# Patient Record
Sex: Male | Born: 1960
Health system: Southern US, Community
[De-identification: ages and names within clinical notes are randomized; demographics above are authoritative.]

## PROBLEM LIST (undated history)

## (undated) DIAGNOSIS — Z8614 Personal history of Methicillin resistant Staphylococcus aureus infection: Secondary | ICD-10-CM

---

## 2008-09-05 ENCOUNTER — Emergency Department (HOSPITAL_COMMUNITY): Admission: EM | Admit: 2008-09-05 | Discharge: 2008-09-05 | Payer: Self-pay | Admitting: Emergency Medicine

## 2008-10-08 ENCOUNTER — Emergency Department (HOSPITAL_COMMUNITY): Admission: EM | Admit: 2008-10-08 | Discharge: 2008-10-08 | Payer: Self-pay | Admitting: Emergency Medicine

## 2009-12-22 ENCOUNTER — Emergency Department (HOSPITAL_COMMUNITY): Admission: EM | Admit: 2009-12-22 | Discharge: 2009-12-22 | Payer: Self-pay | Admitting: Emergency Medicine

## 2010-02-09 ENCOUNTER — Emergency Department (HOSPITAL_COMMUNITY): Admission: EM | Admit: 2010-02-09 | Discharge: 2010-02-09 | Payer: Self-pay | Admitting: Emergency Medicine

## 2011-10-30 IMAGING — CR DG CERVICAL SPINE COMPLETE 4+V
5 series · 5 of 5 positions shown · non-contrast
Comparison: None.

CLINICAL DATA: Work injury with neck pain and left shoulder pain.

CERVICAL SPINE - COMPLETE 4+ VIEW

[w c-spine lat]
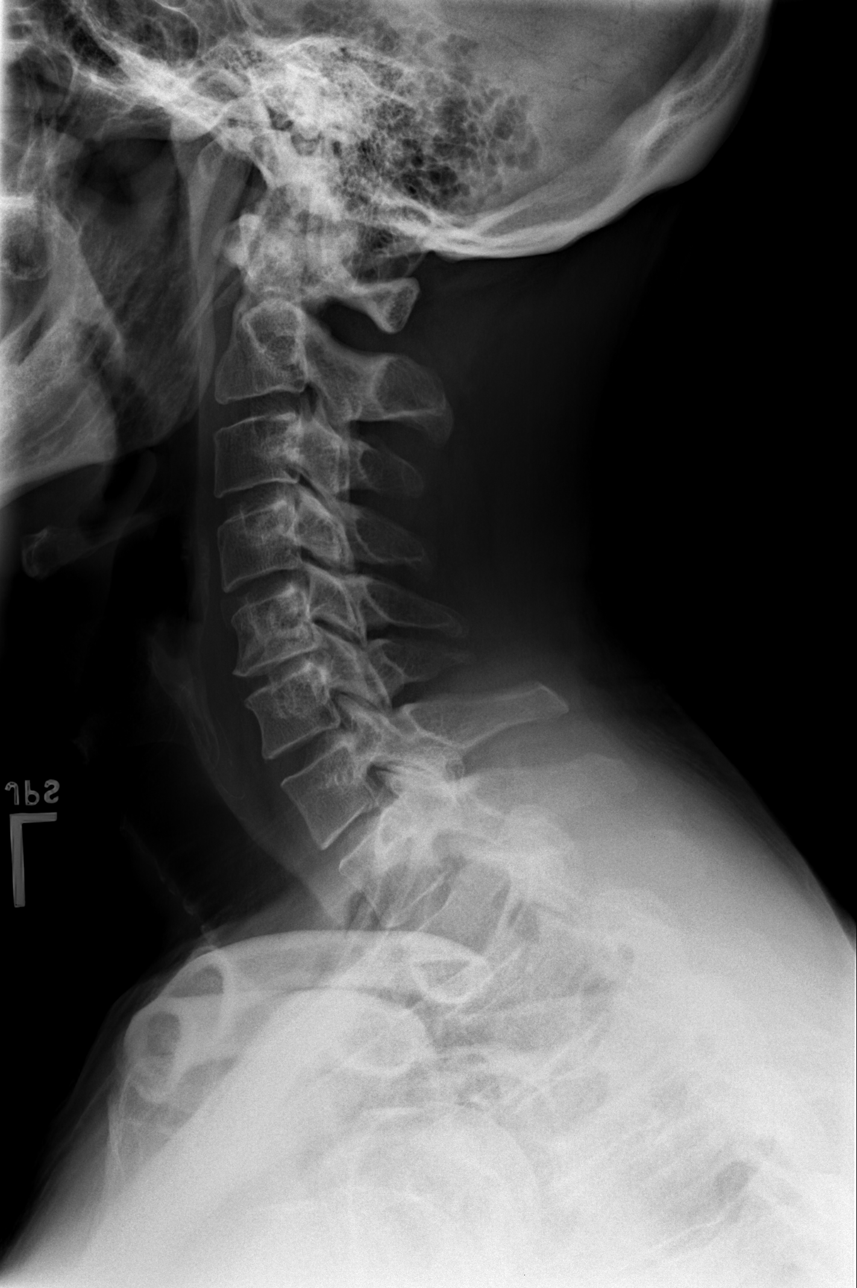

[w c-spine oblique * (1 of 2)]
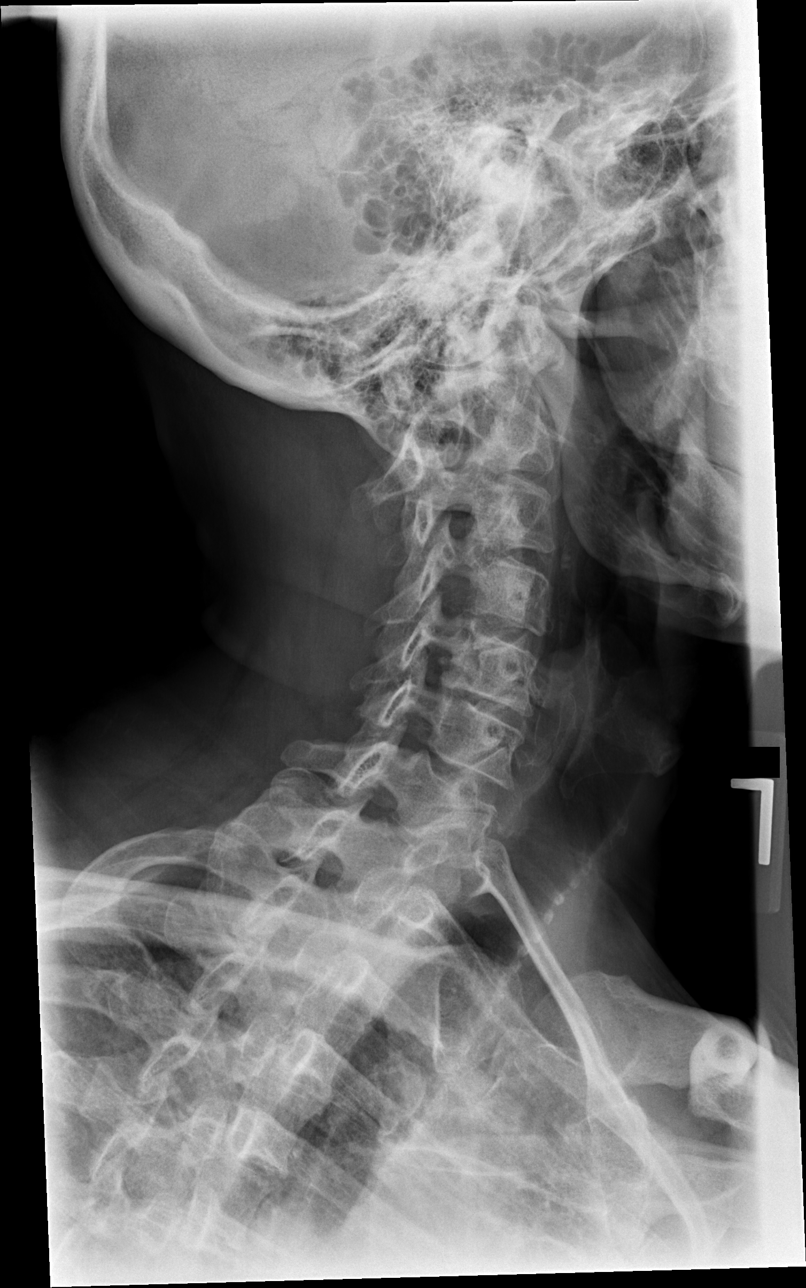

[w c-spine oblique * (2 of 2)]
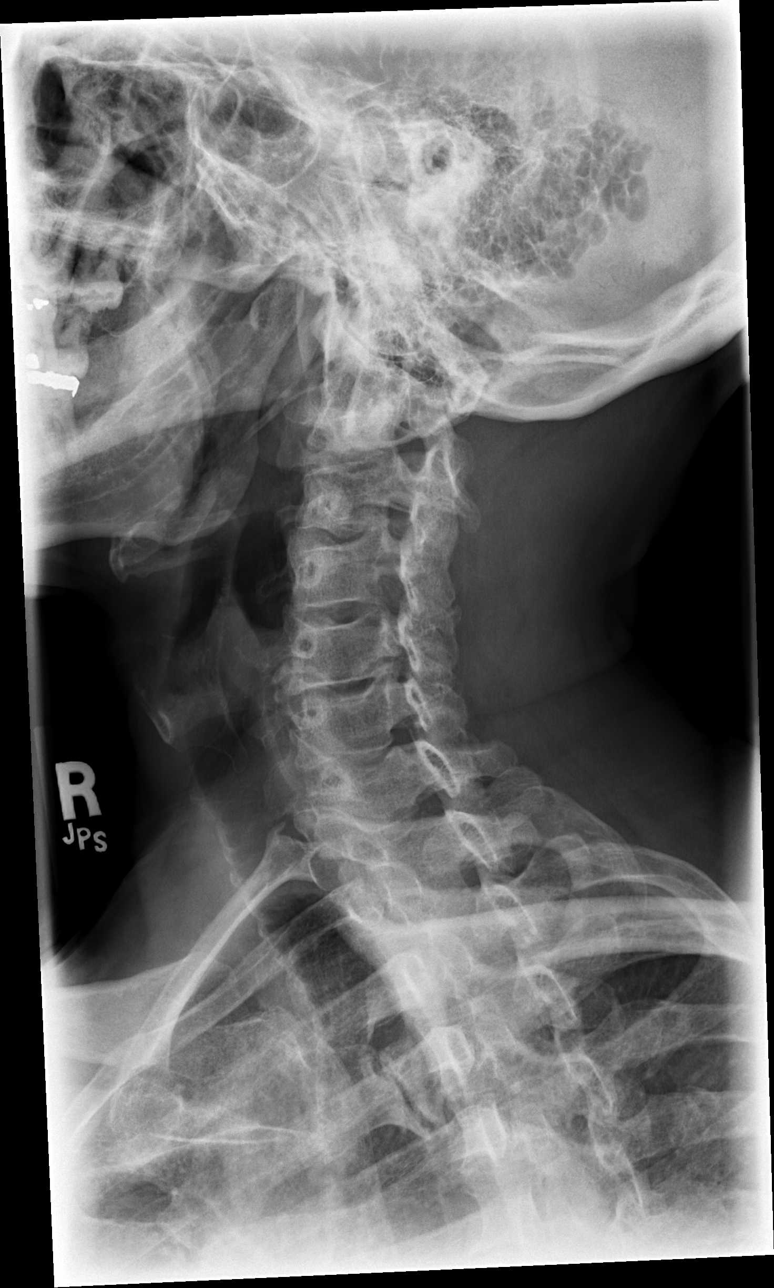

[w c-spine a.p.]
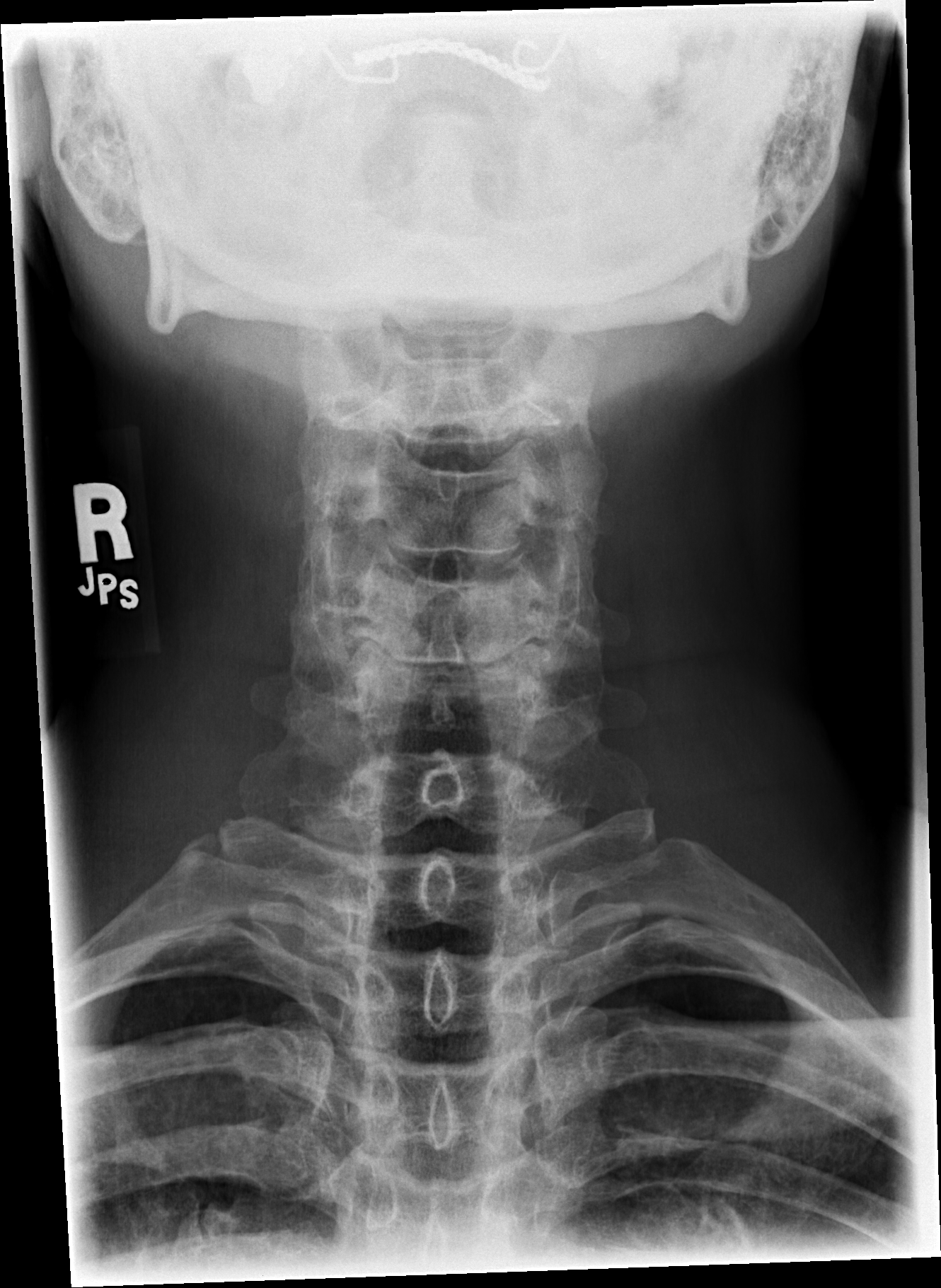

[w c-spine odontoid]
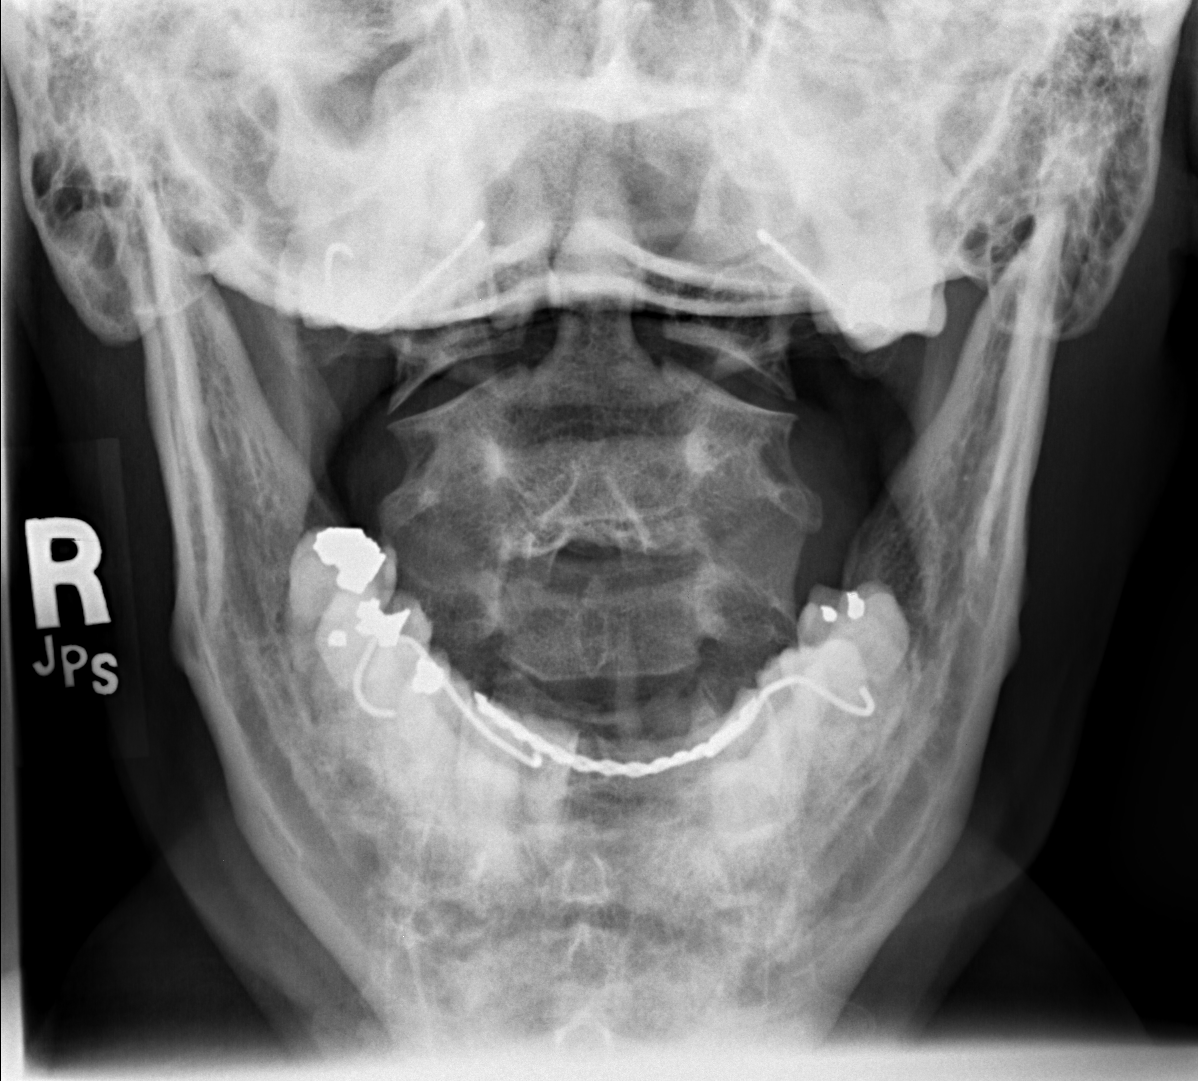

[5 of 5 positions shown; findings below may reference images not displayed]

FINDINGS: There is no evidence of cervical fracture or subluxation.
Moderate spondylosis present at C5-6 with disc space narrowing and
proliferative changes present.  Oblique views show mild to moderate
bilateral bony foraminal stenosis at this level.

Minimal spondylosis at C4-5 and C6-7.  No soft tissue swelling.
IMPRESSION: Moderate cervical spondylosis at C5-6 causing visible bilateral
bony foraminal stenosis.

## 2014-01-17 ENCOUNTER — Encounter (HOSPITAL_COMMUNITY): Payer: Self-pay | Admitting: Emergency Medicine

## 2014-01-17 ENCOUNTER — Emergency Department (HOSPITAL_COMMUNITY)
Admission: EM | Admit: 2014-01-17 | Discharge: 2014-01-17 | Disposition: A | Discharge status: Home / Self Care | Payer: Self-pay | Attending: Emergency Medicine | Admitting: Emergency Medicine

## 2014-01-17 DIAGNOSIS — L308 Other specified dermatitis: Secondary | ICD-10-CM

## 2014-01-17 DIAGNOSIS — Z720 Tobacco use: Secondary | ICD-10-CM | POA: Insufficient documentation

## 2014-01-17 DIAGNOSIS — B029 Zoster without complications: Secondary | ICD-10-CM

## 2014-01-17 DIAGNOSIS — B028 Zoster with other complications: Secondary | ICD-10-CM | POA: Insufficient documentation

## 2014-01-17 MED ORDER — ACYCLOVIR 400 MG PO TABS: 400.0000 mg | ORAL_TABLET | Freq: Four times a day (QID) | ORAL | Status: DC

## 2014-01-17 MED ORDER — OXYCODONE-ACETAMINOPHEN 5-325 MG PO TABS: 2.0000 | ORAL_TABLET | ORAL | Status: DC | PRN

## 2014-01-17 NOTE — ED Notes (Signed)
Pt c/o burning painful rash to left ribcage.

## 2014-01-17 NOTE — Discharge Instructions (Signed)

## 2014-01-17 NOTE — ED Provider Notes (Signed)
CSN: [id number]     Arrival date & time 01/17/14  [id number] History  This chart was scribed for Alfred Ponce W Brandis Matsuura, MD by Leone PayorSonum Patel, ED Scribe. This patient was seen in room APA07/APA07 and the patient's care was started 7:04 AM.    Chief Complaint  Patient presents with  . Herpes Zoster   Patient is a 53 y.o. male presenting with rash. The history is provided by the patient. No language interpreter was used.  Rash Location:  Torso Torso rash location:  L axilla and L chest Quality: burning and painful   Pain details:    Quality:  Burning   Severity:  Mild   Onset quality:  Gradual   Timing:  Constant Severity:  Mild Onset quality:  Gradual Duration:  4 days Timing:  Constant Progression:  Spreading Chronicity:  New Relieved by:  Nothing Worsened by:  Nothing tried Associated symptoms: no abdominal pain, no fever, no shortness of breath and not vomiting     HPI Comments: Alfred Ponce is a 53 y.o. male who presents to the Emergency Department complaining of 4-5 days of a gradual onset, constant, gradually worsening rash to the left chest and rib area. He describes the pain as burning in nature. He denies similar symptoms in the past. He denies fever, vomiting, chest pain, SOB, abdominal pain.   PMH - none  History  Substance Use Topics  . Smoking status: Current Every Day Smoker -- 0.50 packs/day    Types: Cigarettes  . Smokeless tobacco: Not on file  . Alcohol Use: Yes    Review of Systems  Constitutional: Negative for fever.  Respiratory: Negative for shortness of breath.   Cardiovascular: Negative for chest pain.  Gastrointestinal: Negative for vomiting and abdominal pain.  Skin: Positive for rash.      Allergies  Review of patient's allergies indicates no known allergies.  Home Medications   Prior to Admission medications   Not on File   BP 129/92  Pulse 68  Temp(Src) 97.7 F (36.5 C)  Resp 18  Ht 5\' 7"  (1.702 m)  Wt 160 lb (72.576 kg)  BMI 25.05 kg/m2   SpO2 100% Physical Exam  Nursing note and vitals reviewed. CONSTITUTIONAL: Well developed/well nourished HEAD: Normocephalic/atraumatic EYES: EOMI/PERRL ENMT: Mucous membranes moist NECK: supple no meningeal signs SPINE:entire spine nontender CV: S1/S2 noted, no murmurs/rubs/gallops noted LUNGS: Lungs are clear to auscultation bilaterally, no apparent distress ABDOMEN: soft, nontender, no rebound or guarding GU:no cva tenderness NEURO: Pt is awake/alert, moves all extremitiesx4 EXTREMITIES: pulses normal, full ROM SKIN: warm, color normal, herpetic rash on the left chest in dermatomal fashion  PSYCH: no abnormalities of mood noted   ED Course  Procedures DIAGNOSTIC STUDIES: Oxygen Saturation is 100% on RA, normal by my interpretation.    COORDINATION OF CARE: 7:08 AM Discussed treatment plan with pt at bedside and pt agreed to plan.     MDM   Final diagnoses:  Shingles  Herpes zoster dermatitis    Nursing notes including past medical history and social history reviewed and considered in documentation   I personally performed the services described in this documentation, which was scribed in my presence. The recorded information has been reviewed and is accurate.      Alfred Ponce W Davinci Glotfelty, MD 01/17/14 [phone]

## 2015-01-19 ENCOUNTER — Inpatient Hospital Stay (HOSPITAL_COMMUNITY)
Admission: EM | Admit: 2015-01-19 | Discharge: 2015-01-20 | DRG: 603 | Disposition: A | Discharge status: Home / Self Care | Payer: Managed Care, Other (non HMO) | Attending: Internal Medicine | Admitting: Internal Medicine

## 2015-01-19 ENCOUNTER — Emergency Department (HOSPITAL_COMMUNITY): Payer: Managed Care, Other (non HMO)

## 2015-01-19 ENCOUNTER — Encounter (HOSPITAL_COMMUNITY): Payer: Self-pay | Admitting: Emergency Medicine

## 2015-01-19 DIAGNOSIS — L03115 Cellulitis of right lower limb: Principal | ICD-10-CM | POA: Diagnosis present

## 2015-01-19 DIAGNOSIS — F17210 Nicotine dependence, cigarettes, uncomplicated: Secondary | ICD-10-CM | POA: Diagnosis present

## 2015-01-19 DIAGNOSIS — L03119 Cellulitis of unspecified part of limb: Secondary | ICD-10-CM | POA: Diagnosis present

## 2015-01-19 HISTORY — DX: Personal history of Methicillin resistant Staphylococcus aureus infection: Z86.14

## 2015-01-19 LAB — COMPREHENSIVE METABOLIC PANEL
ALK PHOS: 48 U/L (ref 38–126)
ALT: 19 U/L (ref 17–63)
AST: 26 U/L (ref 15–41)
Albumin: 5 g/dL (ref 3.5–5.0)
Anion gap: 10 (ref 5–15)
BILIRUBIN TOTAL: 0.9 mg/dL (ref 0.3–1.2)
BUN: 12 mg/dL (ref 6–20)
CALCIUM: 9.3 mg/dL (ref 8.9–10.3)
CO2: 27 mmol/L (ref 22–32)
CREATININE: 1.02 mg/dL (ref 0.61–1.24)
Chloride: 98 mmol/L — ABNORMAL LOW (ref 101–111)
Glucose, Bld: 72 mg/dL (ref 65–99)
Potassium: 3.6 mmol/L (ref 3.5–5.1)
Sodium: 135 mmol/L (ref 135–145)
Total Protein: 8.4 g/dL — ABNORMAL HIGH (ref 6.5–8.1)

## 2015-01-19 LAB — TSH: TSH: 0.911 u[IU]/mL (ref 0.350–4.500)

## 2015-01-19 LAB — CBC WITH DIFFERENTIAL/PLATELET
BASOS ABS: 0 10*3/uL (ref 0.0–0.1)
Basophils Relative: 0 %
Eosinophils Absolute: 0 10*3/uL (ref 0.0–0.7)
Eosinophils Relative: 0 %
HEMATOCRIT: 39.5 % (ref 39.0–52.0)
HEMOGLOBIN: 13.6 g/dL (ref 13.0–17.0)
LYMPHS ABS: 1.6 10*3/uL (ref 0.7–4.0)
LYMPHS PCT: 8 %
MCH: 31.6 pg (ref 26.0–34.0)
MCHC: 34.4 g/dL (ref 30.0–36.0)
MCV: 91.9 fL (ref 78.0–100.0)
Monocytes Absolute: 1.3 10*3/uL — ABNORMAL HIGH (ref 0.1–1.0)
Monocytes Relative: 6 %
NEUTROS ABS: 18.3 10*3/uL — AB (ref 1.7–7.7)
NEUTROS PCT: 86 %
Platelets: 217 10*3/uL (ref 150–400)
RBC: 4.3 MIL/uL (ref 4.22–5.81)
RDW: 12.8 % (ref 11.5–15.5)
WBC: 21.3 10*3/uL — AB (ref 4.0–10.5)

## 2015-01-19 LAB — I-STAT CG4 LACTIC ACID, ED: LACTIC ACID, VENOUS: 1.42 mmol/L (ref 0.5–2.0)

## 2015-01-19 MED ORDER — ENOXAPARIN SODIUM 40 MG/0.4ML ~~LOC~~ SOLN
40.0000 mg | SUBCUTANEOUS | Status: DC
  Administered 2015-01-19: 40 mg via SUBCUTANEOUS
  Filled 2015-01-19: qty 0.4

## 2015-01-19 MED ORDER — VANCOMYCIN HCL 10 G IV SOLR
1250.0000 mg | Freq: Once | INTRAVENOUS | Status: AC
  Administered 2015-01-19: 1250 mg via INTRAVENOUS
  Filled 2015-01-19: qty 1250

## 2015-01-19 MED ORDER — CLINDAMYCIN PHOSPHATE 900 MG/50ML IV SOLN
900.0000 mg | Freq: Once | INTRAVENOUS | Status: AC
  Administered 2015-01-19: 900 mg via INTRAVENOUS
  Filled 2015-01-19: qty 50

## 2015-01-19 MED ORDER — VANCOMYCIN HCL IN DEXTROSE 750-5 MG/150ML-% IV SOLN
750.0000 mg | Freq: Two times a day (BID) | INTRAVENOUS | Status: DC
  Administered 2015-01-20: 750 mg via INTRAVENOUS
  Filled 2015-01-19 (×3): qty 150

## 2015-01-19 MED ORDER — ONDANSETRON HCL 4 MG PO TABS: 4.0000 mg | ORAL_TABLET | Freq: Four times a day (QID) | ORAL | Status: DC | PRN

## 2015-01-19 MED ORDER — ONDANSETRON HCL 4 MG/2ML IJ SOLN: 4.0000 mg | Freq: Four times a day (QID) | INTRAMUSCULAR | Status: DC | PRN

## 2015-01-19 NOTE — Progress Notes (Signed)
ANTIBIOTIC CONSULT NOTE - INITIAL  Pharmacy Consult for vancomycin Indication: cellulitis  No Known Allergies  Patient Measurements: Height:  (170.2 cm) Weight: 152 lb (68.947 kg) IBW/kg (Calculated) : 66.1   Vital Signs: Temp: 98.4 F (36.9 C) (10/10 1703) Temp Source: Oral (10/10 1703) BP: 108/68 mmHg (10/10 1929) Pulse Rate: 93 (10/10 1929) Intake/Output from previous day:   Intake/Output from this shift:    Labs:  Recent Labs  01/19/15 1718  WBC 21.3*  HGB 13.6  PLT 217  CREATININE 1.02   Estimated Creatinine Clearance: 76.5 mL/min (by C-G formula based on Cr of 1.02). No results for input(s): VANCOTROUGH, VANCOPEAK, VANCORANDOM, GENTTROUGH, GENTPEAK, GENTRANDOM, TOBRATROUGH, TOBRAPEAK, TOBRARND, AMIKACINPEAK, AMIKACINTROU, AMIKACIN in the last 72 hours.   Microbiology: No results found for this or any previous visit (from the past 720 hour(s)).  Medical History: Past Medical History  Diagnosis Date  . Hx MRSA infection     Lt lower leg    Medications:  See medication history Assessment: 54 yo man to start vancomycin for cellulitis.  Goal of Therapy:  Vancomycin trough level 10-15 mcg/ml  Plan:  Vancomycin 1250 mg IV X 1 then 750 mg IV q12 hours F/u renal function, cultures and clinical course  Thanks for allowing pharmacy to be a part of this patient's care.  Talbert Cage, PharmD Clinical Pharmacist 01/19/2015,7:49 PM

## 2015-01-19 NOTE — ED Provider Notes (Signed)
CSN: [id number]     Arrival date & time 01/19/15  1655 History   First MD Initiated Contact with Patient 01/19/15 1814     Chief Complaint  Patient presents with  . Cellulitis    HPI  Pt was seen at 1825.  Per pt, c/o unknown onset and persistence of constant "red rash" to his right anterior tibial area that he just noticed today. Has been associated with shaking chills/subjective fevers. Pt endorses hx of LLE cellulitis and MRSA. Denies any other symptoms. Denies N/V/D, no abd pain, no back pain, no CP/SOB, no cough.    Past Medical History  Diagnosis Date  . Hx MRSA infection     Lt lower leg   History reviewed. No pertinent past surgical history.  Social History  Substance Use Topics  . Smoking status: Current Every Day Smoker -- 0.50 packs/day    Types: Cigarettes  . Smokeless tobacco: None  . Alcohol Use: Yes     Comment: occassionally    Review of Systems ROS: Statement: All systems negative except as marked or noted in the HPI; Constitutional: Negative for objective fever and +chills. ; ; Eyes: Negative for eye pain, redness and discharge. ; ; ENMT: Negative for ear pain, hoarseness, nasal congestion, sinus pressure and sore throat. ; ; Cardiovascular: Negative for chest pain, palpitations, diaphoresis, dyspnea and peripheral edema. ; ; Respiratory: Negative for cough, wheezing and stridor. ; ; Gastrointestinal: Negative for nausea, vomiting, diarrhea, abdominal pain, blood in stool, hematemesis, jaundice and rectal bleeding. . ; ; Genitourinary: Negative for dysuria, flank pain and hematuria. ; ; Musculoskeletal: Negative for back pain and neck pain. Negative for swelling and trauma.; ; Skin: +rash. Negative for pruritus, abrasions, blisters, bruising and skin lesion.; ; Neuro: Negative for headache, lightheadedness and neck stiffness. Negative for weakness, altered level of consciousness , altered mental status, extremity weakness, paresthesias, involuntary movement, seizure and  syncope.     Allergies  Review of patient's allergies indicates no known allergies.  Home Medications   Prior to Admission medications   Medication Sig Start Date End Date Taking? Authorizing Provider  acyclovir (ZOVIRAX) 400 MG tablet Take 1 tablet (400 mg total) by mouth 4 (four) times daily. 01/17/14   Zadie Rhine, MD  oxyCODONE-acetaminophen (PERCOCET/ROXICET) 5-325 MG per tablet Take 2 tablets by mouth every 4 (four) hours as needed for severe pain. 01/17/14   Zadie Rhine, MD   BP 141/97 mmHg  Pulse 108  Temp(Src) 98.4 F (36.9 C) (Oral)  Resp 18  Ht  (1.702 m)  Wt 152 lb (68.947 kg)  BMI 23.80 kg/m2  SpO2 100% Physical Exam  1830: Physical examination:  Nursing notes reviewed; Vital signs and O2 SAT reviewed;  Constitutional: Well developed, Well nourished, Well hydrated, In no acute distress; Head:  Normocephalic, atraumatic; Eyes: EOMI, PERRL, No scleral icterus; ENMT: Mouth and pharynx normal, Mucous membranes moist; Neck: Supple, Full range of motion, no meningeal signs. No lymphadenopathy; Cardiovascular: Regular rate and rhythm, No murmur, rub, or gallop; Respiratory: Breath sounds clear & equal bilaterally, No rales, rhonchi, wheezes.  Speaking full sentences with ease, Normal respiratory effort/excursion; Chest: Nontender, Movement normal; Abdomen: Soft, Nontender, Nondistended, Normal bowel sounds; Genitourinary: No CVA tenderness; Extremities: Pulses normal, +approximately 10cm area of erythema to right anterior tibia, no open wounds, no soft tissue crepitus, no ecchymosis. No deformity, no tenderness, No edema, No calf edema or asymmetry.; Neuro: AA&Ox3, Major CN grossly intact.  Speech clear. No gross focal motor or sensory deficits  in extremities.; Skin: Color normal, Warm, Dry.   ED Course  Procedures (including critical care time) Labs Review   Imaging Review  I have personally reviewed and evaluated these images and lab results as part of my medical  decision-making.   EKG Interpretation None      MDM  MDM Reviewed: previous chart, nursing note and vitals Interpretation: labs and x-ray     Results for orders placed or performed during the hospital encounter of 01/19/15  CBC with Differential  Result Value Ref Range   WBC 21.3 (H) 4.0 - 10.5 K/uL   RBC 4.30 4.22 - 5.81 MIL/uL   Hemoglobin 13.6 13.0 - 17.0 g/dL   HCT 44.0 34.7 - 42.5 %   MCV 91.9 78.0 - 100.0 fL   MCH 31.6 26.0 - 34.0 pg   MCHC 34.4 30.0 - 36.0 g/dL   RDW 95.6 38.7 - 56.4 %   Platelets 217 150 - 400 K/uL   Neutrophils Relative % 86 %   Neutro Abs 18.3 (H) 1.7 - 7.7 K/uL   Lymphocytes Relative 8 %   Lymphs Abs 1.6 0.7 - 4.0 K/uL   Monocytes Relative 6 %   Monocytes Absolute 1.3 (H) 0.1 - 1.0 K/uL   Eosinophils Relative 0 %   Eosinophils Absolute 0.0 0.0 - 0.7 K/uL   Basophils Relative 0 %   Basophils Absolute 0.0 0.0 - 0.1 K/uL  Comprehensive metabolic panel  Result Value Ref Range   Sodium 135 135 - 145 mmol/L   Potassium 3.6 3.5 - 5.1 mmol/L   Chloride 98 (L) 101 - 111 mmol/L   CO2 27 22 - 32 mmol/L   Glucose, Bld 72 65 - 99 mg/dL   BUN 12 6 - 20 mg/dL   Creatinine, Ser 3.32 0.61 - 1.24 mg/dL   Calcium 9.3 8.9 - 95.1 mg/dL   Total Protein 8.4 (H) 6.5 - 8.1 g/dL   Albumin 5.0 3.5 - 5.0 g/dL   AST 26 15 - 41 U/L   ALT 19 17 - 63 U/L   Alkaline Phosphatase 48 38 - 126 U/L   Total Bilirubin 0.9 0.3 - 1.2 mg/dL   GFR calc non Af Amer >60 >60 mL/min   GFR calc Af Amer >60 >60 mL/min   Anion gap 10 5 - 15  I-Stat CG4 Lactic Acid, ED  Result Value Ref Range   Lactic Acid, Venous 1.42 0.5 - 2.0 mmol/L   Dg Tibia/fibula Right 01/19/2015   CLINICAL DATA:  Right lower extremity swelling and rash. No reported injury.  EXAM: RIGHT TIBIA AND FIBULA - 2 VIEW  COMPARISON:  None.  FINDINGS: There is no evidence of fracture, periosteal reaction, cortical erosions or other focal bone lesions. Soft tissues are unremarkable.  IMPRESSION: Negative.    Electronically Signed   By: Delbert Phenix M.D.   On: 01/19/2015 18:52    1850:  Significantly elevated WBC count. Will dose IV clindamycin. Dx and testing d/w pt.  Questions answered.  Verb understanding, agreeable to observation admit. T/C to [facility] Dr. Karilyn Cota, case discussed, including:  HPI, pertinent PM/SHx, VS/PE, dx testing, ED course and treatment:  Agreeable to admit, requests to write temporary orders, obtain observation medical bed to team APAdmits.   Samuel Jester, DO 01/21/15 1955

## 2015-01-19 NOTE — H&P (Signed)
[facility] History and Physical  Alfred Ponce HQI:[id number] DOB: 09-Mar-1961 DOA: 01/19/2015  Referring physician: ER PCP: No primary care provider on file.   Chief Complaint: Redness and swelling of the right lower leg  HPI: Alfred Ponce is a 54 y.o. male  This is a very pleasant 54 year old man who presents today with redness in the right lower leg. He also noticed chills/shaking with subjective fevers. Is not diabetic. He does have a previous history of MRSA infection. Evaluation in the emergency room shows him to have cellulitis of the right lower leg. He is now being admitted for further management of this.   Review of Systems:  Apart from symptoms above, all systems are negative.  Past Medical History  Diagnosis Date  . Hx MRSA infection     Lt lower leg   History reviewed. No pertinent past surgical history. Social History:  reports that he has been smoking Cigarettes.  He has been smoking about 0.50 packs per day. He does not have any smokeless tobacco history on file. He reports that he drinks alcohol. He reports that he does not use illicit drugs.  No Known Allergies   Family history: No family history of diabetes.   Prior to Admission medications   Not on File   Physical Exam: Filed Vitals:   01/19/15 1703 01/19/15 1929  BP: 141/97 108/68  Pulse: 108 93  Temp: 98.4 F (36.9 C)   TempSrc: Oral   Resp: 18 18  Height:  (1.702 m)   Weight: 68.947 kg (152 lb)   SpO2: 100% 99%    Wt Readings from Last 3 Encounters:  01/19/15 68.947 kg (152 lb)  01/17/14 72.576 kg (160 lb)    General:  Appears calm and comfortable. He is not toxic or septic. Eyes: PERRL, normal lids, irises & conjunctiva ENT: grossly normal hearing, lips & tongue Neck: no LAD, masses or thyromegaly Cardiovascular: RRR, no m/r/g. No LE edema. Telemetry: SR, no arrhythmias  Respiratory: CTA bilaterally, no w/r/r. Normal respiratory effort. Abdomen: soft, ntnd Skin: Cellulitis of  the right lower leg extending from the knee down was the ankle, mainly affecting the shin area Musculoskeletal: grossly normal tone BUE/BLE Psychiatric: grossly normal mood and affect, speech fluent and appropriate Neurologic: grossly non-focal.          Labs on Admission:  Basic Metabolic Panel:  Recent Labs Lab 01/19/15 1718  NA 135  K 3.6  CL 98*  CO2 27  GLUCOSE 72  BUN 12  CREATININE 1.02  CALCIUM 9.3   Liver Function Tests:  Recent Labs Lab 01/19/15 1718  AST 26  ALT 19  ALKPHOS 48  BILITOT 0.9  PROT 8.4*  ALBUMIN 5.0   No results for input(s): LIPASE, AMYLASE in the last 168 hours. No results for input(s): AMMONIA in the last 168 hours. CBC:  Recent Labs Lab 01/19/15 1718  WBC 21.3*  NEUTROABS 18.3*  HGB 13.6  HCT 39.5  MCV 91.9  PLT 217   Cardiac Enzymes: No results for input(s): CKTOTAL, CKMB, CKMBINDEX, TROPONINI in the last 168 hours.  BNP (last 3 results) No results for input(s): BNP in the last 8760 hours.  ProBNP (last 3 results) No results for input(s): PROBNP in the last 8760 hours.  CBG: No results for input(s): GLUCAP in the last 168 hours.  Radiological Exams on Admission: Dg Tibia/fibula Right  01/19/2015   CLINICAL DATA:  Right lower extremity swelling and rash. No reported injury.  EXAM: RIGHT TIBIA AND  FIBULA - 2 VIEW  COMPARISON:  None.  FINDINGS: There is no evidence of fracture, periosteal reaction, cortical erosions or other focal bone lesions. Soft tissues are unremarkable.  IMPRESSION: Negative.   Electronically Signed   By: Delbert Phenix M.D.   On: 01/19/2015 18:52      Assessment/Plan   1. Cellulitis of the right lower leg. He will be treated with intravenous vancomycin.  He will be admitted to the medical floor. Further recommendations will depend on patient's hospital progress.  Code Status: Full code  DVT Prophylaxis: Lovenox.  Family Communication: I discussed the plan with the patient at the bedside.    Disposition Plan: Home when medically stable.   Time spent: 45 minutes.  Wilson Singer [facility] Pager [phone].

## 2015-01-19 NOTE — ED Notes (Signed)
Noticed redness with pain and slight swelling this am - lower Rt leg(shin area) . Skin red and inflamed , hot to touch

## 2015-01-19 NOTE — ED Notes (Signed)
Pt c/o nagging pain to RLE that started this morning. Pt presents with redness, swelling and heat to right mid shin area that extends to the inner right lower leg. Pt reports he has hx of cellulitis x 1. Pt denies fever, but reports chills. Denies nausea and vomiting.

## 2015-01-20 DIAGNOSIS — L03115 Cellulitis of right lower limb: Principal | ICD-10-CM

## 2015-01-20 LAB — COMPREHENSIVE METABOLIC PANEL
ALT: 13 U/L — ABNORMAL LOW (ref 17–63)
AST: 18 U/L (ref 15–41)
Albumin: 3.7 g/dL (ref 3.5–5.0)
Alkaline Phosphatase: 41 U/L (ref 38–126)
Anion gap: 6 (ref 5–15)
BUN: 11 mg/dL (ref 6–20)
CHLORIDE: 105 mmol/L (ref 101–111)
CO2: 25 mmol/L (ref 22–32)
Calcium: 8.1 mg/dL — ABNORMAL LOW (ref 8.9–10.3)
Creatinine, Ser: 1.03 mg/dL (ref 0.61–1.24)
Glucose, Bld: 106 mg/dL — ABNORMAL HIGH (ref 65–99)
POTASSIUM: 3.7 mmol/L (ref 3.5–5.1)
SODIUM: 136 mmol/L (ref 135–145)
Total Bilirubin: 1.1 mg/dL (ref 0.3–1.2)
Total Protein: 6.7 g/dL (ref 6.5–8.1)

## 2015-01-20 LAB — CBC
HEMATOCRIT: 37.2 % — AB (ref 39.0–52.0)
HEMOGLOBIN: 12.7 g/dL — AB (ref 13.0–17.0)
MCH: 31.3 pg (ref 26.0–34.0)
MCHC: 34.1 g/dL (ref 30.0–36.0)
MCV: 91.6 fL (ref 78.0–100.0)
Platelets: 196 10*3/uL (ref 150–400)
RBC: 4.06 MIL/uL — AB (ref 4.22–5.81)
RDW: 12.8 % (ref 11.5–15.5)
WBC: 9.6 10*3/uL (ref 4.0–10.5)

## 2015-01-20 MED ORDER — IBUPROFEN 600 MG PO TABS: 600.0000 mg | ORAL_TABLET | Freq: Four times a day (QID) | ORAL | Status: DC | PRN

## 2015-01-20 MED ORDER — DOXYCYCLINE HYCLATE 100 MG PO CAPS: 100.0000 mg | ORAL_CAPSULE | Freq: Two times a day (BID) | ORAL | Status: DC

## 2015-01-20 NOTE — Progress Notes (Signed)
Discharge instructions given on medications,and follow up visits, patient verbalized understanding. No c/o pain or discomfort noted. Vital signs stable. Accompanied by staff to an awaiting vehicle. 

## 2015-01-20 NOTE — Discharge Summary (Signed)
Physician Discharge Summary  Alfred Ponce WUJ:[id number] DOB: December 12, 1960 DOA: 01/19/2015  PCP: No primary care provider on file.  Admit date: 01/19/2015 Discharge date: 01/20/2015  Time spent: 25 minutes  Recommendations for Outpatient Follow-up:  1. Follow up with PCP within 1-2 weeks.  2. Complete course of oral abx. 3. Use NSAIDs for pain.   Discharge Diagnoses:  Active Problems:   Cellulitis of right lower leg   Cellulitis of lower leg   Discharge Condition: Improved  Diet recommendation: Normal  Filed Weights   01/19/15 1703 01/19/15 2106  Weight: 68.947 kg (152 lb) 70.171 kg (154 lb 11.2 oz)    History of present illness:  54 year old male with no significant past medical history presented to the ED with redness and swelling of the right leg. While in the ED, patient was found to have cellulitis of the right lower extremity with moderate leukocytosis. He was afebrile, however, patient was admitted for further management.   Hospital Course:  Patient is a 54 year old male with no significant past medical history who presented to the ED with redness and swelling of right lower extremity. Patient was treated for cellulitis of right lower leg with IV vancomycin. His X-ray of right tibia, fibula was negative. He was noted to have significant Leukocytosis on admission but no evidence of fever. With IV abx therapy his leukocytosis has resolved and his cellulitis is clinically improving. He can be transitioned to oral abx. He has been advised to keep his RLE elevated. Hemodynamically, he is stable and does not have any evidence of sepsis at this time.  1. Leukocytosis. WBC improved at 9.6 today.  Procedures:  None  Consultations:  None  Discharge Exam: Filed Vitals:   01/20/15 0548  BP: 108/72  Pulse: 77  Temp: 98.7 F (37.1 C)  Resp: 16     General: Appears calm and comfortable  Cardiovascular: RRR, no m/r/g. No LE edema.  Respiratory: CTA bilaterally, no  w/r/r. Normal respiratory effort.  Skin: no rash or induration seen on limited exam  Musculoskeletal: grossly normal tone BUE/BLE. Erythema over the right anterior aspect of right lower extremity with associated warmth.  Psychiatric: grossly normal mood and affect, speech fluent and appropriate   Discharge Instructions    There are no discharge medications for this patient.  No Known Allergies    The results of significant diagnostics from this hospitalization (including imaging, microbiology, ancillary and laboratory) are listed below for reference.    Significant Diagnostic Studies: Dg Tibia/fibula Right  01/19/2015   CLINICAL DATA:  Right lower extremity swelling and rash. No reported injury.  EXAM: RIGHT TIBIA AND FIBULA - 2 VIEW  COMPARISON:  None.  FINDINGS: There is no evidence of fracture, periosteal reaction, cortical erosions or other focal bone lesions. Soft tissues are unremarkable.  IMPRESSION: Negative.   Electronically Signed   By: Delbert Phenix M.D.   On: 01/19/2015 18:52    Labs: Basic Metabolic Panel:  Recent Labs Lab 01/19/15 1718 01/20/15 0710  NA 135 136  K 3.6 3.7  CL 98* 105  CO2 27 25  GLUCOSE 72 106*  BUN 12 11  CREATININE 1.02 1.03  CALCIUM 9.3 8.1*   Liver Function Tests:  Recent Labs Lab 01/19/15 1718 01/20/15 0710  AST 26 18  ALT 19 13*  ALKPHOS 48 41  BILITOT 0.9 1.1  PROT 8.4* 6.7  ALBUMIN 5.0 3.7  CBC:  Recent Labs Lab 01/19/15 1718 01/20/15 0710  WBC 21.3* 9.6  NEUTROABS 18.3*  --   HGB 13.6 12.7*  HCT 39.5 37.2*  MCV 91.9 91.6  PLT 217 196    By signing my name below, I, Edman Circle attest that this documentation has been prepared under the direction and in the presence of Erick Blinks, MD  Electronically signed: Edman Circle  01/20/2015 12:30 PM    Signed:  Erick Blinks, M.D.  [facility] 01/20/2015, 12:33 PM   I, Dr. Erick Blinks, personally performed the services described in this  documentaiton. All medical record entries made by the scribe were at my direction and in my presence. I have reviewed the chart and agree that the record reflects my personal performance and is accurate and complete  Erick Blinks, MD, 01/20/2015 8:07 PM

## 2015-01-20 NOTE — Care Management Note (Signed)
Case Management Note  Patient Details  Name: Alfred Ponce MRN: [id number] Date of Birth: 07-28-1960  Subjective/Objective:                  Pt admitted from home with cellulitis. Pt lives with his wife and will return home at discharge. Pt is independent with ADL's.  Action/Plan: Pt does not have a PCP. Pts wife stated that Dr. Karilyn Cota saw pt in the ED and Dr. Karilyn Cota would take pt on after discharge. Pt given the contact number for Dr. Karilyn Cota to call to get established. Pt for discharge home today.  Expected Discharge Date:                  Expected Discharge Plan:  Home/Self Care  In-House Referral:  NA  Discharge planning Services  Follow-up appt scheduled, CM Consult  Post Acute Care Choice:  NA Choice offered to:  NA  DME Arranged:    DME Agency:     HH Arranged:    HH Agency:     Status of Service:  Completed, signed off  Medicare Important Message Given:    Date Medicare IM Given:    Medicare IM give by:    Date Additional Medicare IM Given:    Additional Medicare Important Message give by:     If discussed at Long Length of Stay Meetings, dates discussed:    Additional Comments:  Cheryl Flash, RN 01/20/2015, 1:31 PM

## 2015-01-21 LAB — HEMOGLOBIN A1C
HEMOGLOBIN A1C: 4.9 % (ref 4.8–5.6)
Mean Plasma Glucose: 94 mg/dL

## 2016-10-12 ENCOUNTER — Encounter (HOSPITAL_COMMUNITY): Payer: Self-pay | Admitting: *Deleted

## 2016-10-12 ENCOUNTER — Emergency Department (HOSPITAL_COMMUNITY)
Admission: EM | Admit: 2016-10-12 | Discharge: 2016-10-12 | Disposition: A | Discharge status: Home / Self Care | Payer: Self-pay | Attending: Emergency Medicine | Admitting: Emergency Medicine

## 2016-10-12 DIAGNOSIS — F17200 Nicotine dependence, unspecified, uncomplicated: Secondary | ICD-10-CM | POA: Insufficient documentation

## 2016-10-12 DIAGNOSIS — K0889 Other specified disorders of teeth and supporting structures: Secondary | ICD-10-CM | POA: Insufficient documentation

## 2016-10-12 MED ORDER — AMOXICILLIN-POT CLAVULANATE 875-125 MG PO TABS: 1.0000 | ORAL_TABLET | Freq: Two times a day (BID) | ORAL | 0 refills | Status: DC

## 2016-10-12 NOTE — ED Provider Notes (Signed)
[facility]EM[facility] DEPT Provider Note   CSN: [id number] Arrival date & time: 10/12/16  1800     History   Chief Complaint Chief Complaint  Patient presents with  . Dental Pain    HPI Alfred Ponce is a 56 y.o. male presenting with one-day history of tooth pain and facial swelling.  Patient states that last night he started to have been odd sensation in his right upper molars. He describes it as a tingling sensation. This morning he woke up with pain in the tooth and some facial swelling. Patient states that he's had multiple teeth extracted due to dental abscesses, and this feels the same. Patient took high-dose ibuprofen with some relief. He denies fever, chills, difficulty swallowing, neck pain or stiffness, chest pain, shortness of breath, or nausea vomiting. Patient states it does not hurt worse to open his mouth. He has not had much to eat today, and is unsure if it hurts to eat.   HPI  History reviewed. No pertinent past medical history.  There are no active problems to display for this patient.   History reviewed. No pertinent surgical history.     Home Medications    Prior to Admission medications   Medication Sig Start Date End Date Taking? Authorizing Provider  amoxicillin-clavulanate (AUGMENTIN) 875-125 MG tablet Take 1 tablet by mouth every 12 (twelve) hours. 10/12/16   Jamiere Gulas, PA-C    Family History No family history on file.  Social History Social History  Substance Use Topics  . Smoking status: Current Every Day Smoker  . Smokeless tobacco: Never Used  . Alcohol use Yes     Allergies   Patient has no known allergies.   Review of Systems Review of Systems  Constitutional: Negative for chills and fever.  HENT: Positive for dental problem and facial swelling. Negative for ear pain, trouble swallowing and voice change.   Eyes: Negative for photophobia and visual disturbance.     Physical Exam Updated Vital Signs BP 116/78   Pulse 89    Temp 98.1 F (36.7 C) (Oral)   Resp 18   Ht 5\' 6"  (1.676 m)   Wt 73.5 kg (162 lb)   SpO2 98%   BMI 26.15 kg/m   Physical Exam  Constitutional: He appears well-developed and well-nourished. No distress.  HENT:  Head: Normocephalic and atraumatic.  Right Ear: Tympanic membrane, external ear and ear canal normal.  Left Ear: Tympanic membrane, external ear and ear canal normal.  Nose: Nose normal. Right sinus exhibits no maxillary sinus tenderness and no frontal sinus tenderness. Left sinus exhibits no maxillary sinus tenderness and no frontal sinus tenderness.  Mouth/Throat: Uvula is midline, oropharynx is clear and moist and mucous membranes are normal. No trismus in the jaw. Abnormal dentition. Dental caries present.  Patient with right-sided facial swelling, predominantly around the upper jaw. Swelling does not cross the zygomatic arch, and does not pass the lower border of mandible. No sign of neck stiffness, pain, or swelling. Patient with multiple dental caries, and missing multiple teeth. Patient has a permanent retainer, preventing assessment of the palate.  Patient with tenderness to palpation of the right upper molar. No obvious dental abscess noted. Slight erythema and swelling of the gingiva.  Eyes: Conjunctivae are normal. Pupils are equal, round, and reactive to light.  Neck: Normal range of motion. Neck supple.  Cardiovascular: Normal rate and regular rhythm.   Pulmonary/Chest: Effort normal and breath sounds normal.  Lymphadenopathy:    He has no cervical  adenopathy.  Neurological: He is alert.  Skin: Skin is warm and dry. He is not diaphoretic.  Psychiatric: He has a normal mood and affect.     ED Treatments / Results  Labs (all labs ordered are listed, but only abnormal results are displayed) Labs Reviewed - No data to display  EKG  EKG Interpretation None       Radiology No results found.  Procedures Procedures (including critical care  time)  Medications Ordered in ED Medications - No data to display   Initial Impression / Assessment and Plan / ED Course  I have reviewed the triage vital signs and the nursing notes.  Pertinent labs & imaging results that were available during my care of the patient were reviewed by me and considered in my medical decision making (see chart for details).     Patient presenting with one-day history of dental pain and facial swelling. States it feels similar to last time he had a dental infection. Patient with multiple dental caries with some erythema and swelling of the gingiva. No signs of trouble swallowing, neck pain and stiffness, or neck swelling. No concern for Ludwig's or encroaching orbital cellulitis at this time. Discussed with patient definitive treatment needs to be done by dentist. In the meantime, will prescribe patient antibiotics. Patient states he has high-dose anti-inflammatories that he can take for pain control. Will give patient resource guide and dentist information to make an appointment. Return precautions given. Patient states he understands and agrees to plan.  Final Clinical Impressions(s) / ED Diagnoses   Final diagnoses:  Pain, dental    New Prescriptions Discharge Medication List as of 10/12/2016  6:49 PM    START taking these medications   Details  amoxicillin-clavulanate (AUGMENTIN) 875-125 MG tablet Take 1 tablet by mouth every 12 (twelve) hours., Starting Wed 10/12/2016, Print         Bogata, Buckner, PA-C 10/12/16 Rayne Du, MD 10/12/16 [phone]

## 2016-10-12 NOTE — Discharge Instructions (Signed)
Take antibiotic as prescribed. You may use ibuprofen as needed for pain control. Take this 3 times a day with meals as needed for pain. You'll need to follow-up with a dentist. [place]. Return to the emergency department if he develops fever, chills, difficulty swallowing, or pain in your neck.

## 2016-10-12 NOTE — ED Triage Notes (Signed)
Swelling right jaw

## 2016-12-09 ENCOUNTER — Emergency Department (HOSPITAL_COMMUNITY)
Admission: EM | Admit: 2016-12-09 | Discharge: 2016-12-09 | Disposition: A | Discharge status: Home / Self Care | Payer: Self-pay | Attending: Emergency Medicine | Admitting: Emergency Medicine

## 2016-12-09 ENCOUNTER — Encounter (HOSPITAL_COMMUNITY): Payer: Self-pay | Admitting: Emergency Medicine

## 2016-12-09 DIAGNOSIS — M109 Gout, unspecified: Secondary | ICD-10-CM | POA: Insufficient documentation

## 2016-12-09 DIAGNOSIS — F17210 Nicotine dependence, cigarettes, uncomplicated: Secondary | ICD-10-CM | POA: Insufficient documentation

## 2016-12-09 MED ORDER — INDOMETHACIN 50 MG PO CAPS: 50.0000 mg | ORAL_CAPSULE | Freq: Two times a day (BID) | ORAL | 0 refills | Status: DC

## 2016-12-09 MED ORDER — PREDNISONE 10 MG PO TABS: ORAL_TABLET | ORAL | 0 refills | Status: DC

## 2016-12-09 NOTE — ED Triage Notes (Signed)
Pt reports R foot pain for the last 2 days   Dr Britt BologneseGosranni is PCP

## 2016-12-09 NOTE — Discharge Instructions (Signed)
Return if any problems.

## 2016-12-09 NOTE — ED Provider Notes (Signed)
[facility] Provider Note   CSN: [id number] Arrival date & time: 12/09/16  1102     History   Chief Complaint Chief Complaint  Patient presents with  . Foot Pain    HPI Alfred Ponce is a 56 y.o. male.  The history is provided by the patient. No language interpreter was used.  Foot Pain  This is a new problem. The current episode started 2 days ago. The problem occurs constantly. The problem has been gradually worsening. Nothing aggravates the symptoms. Nothing relieves the symptoms. He has tried nothing for the symptoms. The treatment provided no relief.  Pt is concerned that he has gout in his foot.  Pt reports redness and pain to the top of his foot.   History reviewed. No pertinent past medical history.  There are no active problems to display for this patient.   History reviewed. No pertinent surgical history.     Home Medications    Prior to Admission medications   Medication Sig Start Date End Date Taking? Authorizing Provider  amoxicillin-clavulanate (AUGMENTIN) 875-125 MG tablet Take 1 tablet by mouth every 12 (twelve) hours. 10/12/16   Caccavale, Sophia, PA-C  indomethacin (INDOCIN) 50 MG capsule Take 1 capsule (50 mg total) by mouth 2 (two) times daily with a meal. 12/09/16   Elson AreasSofia, Shewanda Sharpe K, PA-C  predniSONE (DELTASONE) 10 MG tablet 6,5,4,3,2,1 taper 12/09/16   Elson AreasSofia, Hayzlee Mcsorley K, PA-C    Family History No family history on file.  Social History Social History  Substance Use Topics  . Smoking status: Current Every Day Smoker    Types: Cigarettes  . Smokeless tobacco: Never Used  . Alcohol use Yes     Comment: occasional     Allergies   Patient has no known allergies.   Review of Systems Review of Systems  Constitutional: Negative for fever.  All other systems reviewed and are negative.    Physical Exam Updated Vital Signs BP 121/72   Pulse 96   Temp 98.1 F (36.7 C)   Resp 18   Ht 5\' 6"  (1.676 m)   Wt 72.6 kg (160 lb)   SpO2 97%    BMI 25.82 kg/m   Physical Exam  Constitutional: He is oriented to person, place, and time. He appears well-developed and well-nourished.  HENT:  Head: Normocephalic.  Eyes: EOM are normal.  Neck: Normal range of motion.  Pulmonary/Chest: Effort normal.  Abdominal: He exhibits no distension.  Musculoskeletal: He exhibits tenderness.  Swollen area right foot, dorsal aspect,  Mild redness,    Neurological: He is alert and oriented to person, place, and time.  Psychiatric: He has a normal mood and affect.  Nursing note and vitals reviewed.    ED Treatments / Results  Labs (all labs ordered are listed, but only abnormal results are displayed) Labs Reviewed - No data to display  EKG  EKG Interpretation None       Radiology No results found.  Procedures Procedures (including critical care time)  Medications Ordered in ED Medications - No data to display   Initial Impression / Assessment and Plan / ED Course  I have reviewed the triage vital signs and the nursing notes.  Pertinent labs & imaging results that were available during my care of the patient were reviewed by me and considered in my medical decision making (see chart for details).     Pt advised to return if symptoms worsen or change.   Final Clinical Impressions(s) / ED Diagnoses  Final diagnoses:  Acute gout of right foot, unspecified cause    New Prescriptions Discharge Medication List as of 12/09/2016 11:45 AM     Meds ordered this encounter  Medications  . predniSONE (DELTASONE) 10 MG tablet    Sig: 6,5,4,3,2,1 taper    Dispense:  21 tablet    Refill:  0    Order Specific Question:   Supervising Provider    Answer:   MILLER, BRIAN [[id number]]  . indomethacin (INDOCIN) 50 MG capsule    Sig: Take 1 capsule (50 mg total) by mouth 2 (two) times daily with a meal.    Dispense:  20 capsule    Refill:  0    Order Specific Question:   Supervising Provider    Answer:   Eber Hong [[id number]]  An  After Visit Summary was printed and given to the patient.   Elson Areas, PA-C 12/09/16 1307    Elson Areas, PA-C 12/09/16 1318    Lavera Guise, MD 12/09/16 [phone]

## 2016-12-09 NOTE — ED Triage Notes (Signed)
Foot pain x 2 days

## 2016-12-09 NOTE — ED Triage Notes (Signed)
Pt c/o right foot pain x 2 days.  

## 2017-08-10 ENCOUNTER — Other Ambulatory Visit: Payer: Self-pay

## 2017-08-10 ENCOUNTER — Emergency Department (HOSPITAL_COMMUNITY)
Admission: EM | Admit: 2017-08-10 | Discharge: 2017-08-10 | Disposition: A | Discharge status: Home / Self Care | Payer: Managed Care, Other (non HMO) | Attending: Emergency Medicine | Admitting: Emergency Medicine

## 2017-08-10 ENCOUNTER — Encounter (HOSPITAL_COMMUNITY): Payer: Self-pay | Admitting: *Deleted

## 2017-08-10 DIAGNOSIS — M79605 Pain in left leg: Secondary | ICD-10-CM | POA: Insufficient documentation

## 2017-08-10 DIAGNOSIS — Z77098 Contact with and (suspected) exposure to other hazardous, chiefly nonmedicinal, chemicals: Secondary | ICD-10-CM | POA: Diagnosis not present

## 2017-08-10 DIAGNOSIS — Y9301 Activity, walking, marching and hiking: Secondary | ICD-10-CM | POA: Insufficient documentation

## 2017-08-10 DIAGNOSIS — Y990 Civilian activity done for income or pay: Secondary | ICD-10-CM | POA: Insufficient documentation

## 2017-08-10 DIAGNOSIS — Z79899 Other long term (current) drug therapy: Secondary | ICD-10-CM | POA: Diagnosis not present

## 2017-08-10 DIAGNOSIS — F17210 Nicotine dependence, cigarettes, uncomplicated: Secondary | ICD-10-CM | POA: Diagnosis not present

## 2017-08-10 DIAGNOSIS — Y929 Unspecified place or not applicable: Secondary | ICD-10-CM | POA: Diagnosis not present

## 2017-08-10 NOTE — ED Notes (Signed)
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 Pt states this happened Tuesday. Pt states he started having a burning sensation yesterday. Pt's skin is intact with no discoloration. Pt states his pain is a level 4.

## 2017-08-10 NOTE — ED Triage Notes (Signed)
Pt states x 3 days ago he was at work and stepped in a hole that had ascetic acid in it and his pants got wet; pt is now c/o burning to that leg

## 2017-08-10 NOTE — ED Notes (Signed)
Pt not in room when this nurse walked in to discharge patient. Unable to get patient signature

## 2017-08-10 NOTE — Discharge Instructions (Signed)
At this point I do not suspect significant injuries. Sometimes the irritation of weak solutions of sodium hydroxide can be delayed but this is still usually seen within a few hours.

## 2017-08-15 NOTE — ED Provider Notes (Signed)
[facility] EMERGENCY DEPARTMENT Provider Note   CSN: [id number] Arrival date & time: 08/10/17  1944     History   Chief Complaint Chief Complaint  Patient presents with  . Leg Pain    HPI Omarr Hann is a 57 y.o. male.  HPI   57 year old male presenting with some tingling in his left leg.  Patient was at work 3 days ago when he stepped on a metal grate in the floor and it turned to the side.  He states that he stepped down into a drainage pit to approximately to mid thigh.  He works in [employer] and states that there is some type of spill way containing chemicals that going to this pit.  He showed me some pictures of large metallic VATS at his workplace.  One was labeled acetic acid.  He states that there are other VATS labeled with other chemicals as well.  He states that he has some persistent mild tingling in this leg.  No pain.  No rash, swelling, blistering or other skin lesions.  Denies any pain per se but that it just feels "tingly."  Past Medical History:  Diagnosis Date  . Hx MRSA infection    Lt lower leg    Patient Active Problem List   Diagnosis Date Noted  . Cellulitis of right lower leg 01/19/2015  . Cellulitis of lower leg 01/19/2015    History reviewed. No pertinent surgical history.      Home Medications    Prior to Admission medications   Medication Sig Start Date End Date Taking? Authorizing Provider  doxycycline (VIBRAMYCIN) 100 MG capsule Take 1 capsule (100 mg total) by mouth 2 (two) times daily. 01/20/15   Erick Blinks, MD  ibuprofen (ADVIL,MOTRIN) 600 MG tablet Take 1 tablet (600 mg total) by mouth every 6 (six) hours as needed for moderate pain. 01/20/15   Erick Blinks, MD    Family History History reviewed. No pertinent family history.  Social History Social History   Tobacco Use  . Smoking status: Current Every Day Smoker    Packs/day: 0.50    Types: Cigarettes  . Smokeless tobacco: Never Used  Substance Use Topics   . Alcohol use: Yes    Comment: occassionally  . Drug use: No     Allergies   Patient has no known allergies.   Review of Systems Review of Systems  All systems reviewed and negative, other than as noted in HPI.  Physical Exam Updated Vital Signs BP (!) 134/104   Pulse 89   Temp 98.1 F (36.7 C) (Oral)   Resp 16   Wt 69.9 kg (154 lb)   SpO2 100%   BMI 24.12 kg/m   Physical Exam  Constitutional: He appears well-developed and well-nourished. No distress.  HENT:  Head: Normocephalic and atraumatic.  Eyes: Conjunctivae are normal. Right eye exhibits no discharge. Left eye exhibits no discharge.  Neck: Neck supple.  Cardiovascular: Normal rate, regular rhythm and normal heart sounds. Exam reveals no gallop and no friction rub.  No murmur heard. Pulmonary/Chest: Effort normal and breath sounds normal. No respiratory distress.  Abdominal: Soft. He exhibits no distension. There is no tenderness.  Musculoskeletal: He exhibits no edema or tenderness.  Left lower extremity symmetric as compared to the right.  There is no appreciable swelling.  No cyst concerning skin lesions.  He has good pulses in his feet.  He is neurovascularly intact.  Neurological: He is alert.  Skin: Skin is warm and dry.  Psychiatric: He has a normal mood and affect. His behavior is normal. Thought content normal.  Nursing note and vitals reviewed.    ED Treatments / Results  Labs (all labs ordered are listed, but only abnormal results are displayed) Labs Reviewed - No data to display  EKG None  Radiology No results found.  Procedures Procedures (including critical care time)  Medications Ordered in ED Medications - No data to display   Initial Impression / Assessment and Plan / ED Course  I have reviewed the triage vital signs and the nursing notes.  Pertinent labs & imaging results that were available during my care of the patient were reviewed by me and considered in my medical  decision making (see chart for details).     57 year old male with chemical exposure at work 3 days ago.  He has having some tingling in his legs.  His neuro exam is nonfocal.  I do not see any concerning skin lesions.  At this point, 72 hours out, I do not suspect or anticipate any untoward effect from this exposure.  He has since decontaminated himself.  Return precautions were discussed.  He states that the issue with the drain at work is being addressed.  Final Clinical Impressions(s) / ED Diagnoses   Final diagnoses:  Chemical exposure    ED Discharge Orders    None       Raeford Razor, MD 08/15/17 [phone]

## 2019-09-10 ENCOUNTER — Encounter (HOSPITAL_COMMUNITY): Payer: Self-pay | Admitting: *Deleted

## 2019-09-12 ENCOUNTER — Ambulatory Visit (INDEPENDENT_AMBULATORY_CARE_PROVIDER_SITE_OTHER): Payer: Managed Care, Other (non HMO) | Admitting: Internal Medicine

## 2019-10-18 DIAGNOSIS — Z0189 Encounter for other specified special examinations: Secondary | ICD-10-CM | POA: Diagnosis not present

## 2019-10-18 DIAGNOSIS — R030 Elevated blood-pressure reading, without diagnosis of hypertension: Secondary | ICD-10-CM | POA: Diagnosis not present

## 2019-11-04 DIAGNOSIS — R030 Elevated blood-pressure reading, without diagnosis of hypertension: Secondary | ICD-10-CM | POA: Diagnosis not present

## 2019-11-04 DIAGNOSIS — Z125 Encounter for screening for malignant neoplasm of prostate: Secondary | ICD-10-CM | POA: Diagnosis not present

## 2019-11-14 DIAGNOSIS — R7301 Impaired fasting glucose: Secondary | ICD-10-CM | POA: Diagnosis not present

## 2019-11-14 DIAGNOSIS — F5221 Male erectile disorder: Secondary | ICD-10-CM | POA: Diagnosis not present

## 2019-11-14 DIAGNOSIS — E782 Mixed hyperlipidemia: Secondary | ICD-10-CM | POA: Diagnosis not present

## 2019-12-09 ENCOUNTER — Encounter (HOSPITAL_COMMUNITY): Payer: Self-pay | Admitting: Emergency Medicine

## 2019-12-09 ENCOUNTER — Emergency Department (HOSPITAL_COMMUNITY)
Admission: EM | Admit: 2019-12-09 | Discharge: 2019-12-09 | Disposition: A | Discharge status: Home / Self Care | Payer: BC Managed Care – PPO | Attending: Emergency Medicine | Admitting: Emergency Medicine

## 2019-12-09 ENCOUNTER — Other Ambulatory Visit: Payer: Self-pay

## 2019-12-09 DIAGNOSIS — F17210 Nicotine dependence, cigarettes, uncomplicated: Secondary | ICD-10-CM | POA: Diagnosis not present

## 2019-12-09 DIAGNOSIS — H6122 Impacted cerumen, left ear: Secondary | ICD-10-CM | POA: Insufficient documentation

## 2019-12-09 DIAGNOSIS — H68102 Unspecified obstruction of Eustachian tube, left ear: Secondary | ICD-10-CM | POA: Diagnosis not present

## 2019-12-09 MED ORDER — DOCUSATE SODIUM 50 MG/5ML PO LIQD
50.0000 mg | Freq: Once | ORAL | Status: AC
  Administered 2019-12-09: 50 mg via OTIC
  Filled 2019-12-09 (×2): qty 10

## 2019-12-09 NOTE — ED Provider Notes (Signed)
Emergency Department Provider Note   I have reviewed the triage vital signs and the nursing notes.   HISTORY  Chief Complaint Otalgia   HPI Alfred Ponce is a 59 y.o. male with past history reviewed below presents to the emergency department with left ear feeling "stopped up."  He reports some mild discomfort.  He states he typically cleans inside of his ear canal with Q-tips.  He denies any sudden severe pain while doing this.  No bleeding or drainage from the ear.  Has had decreased hearing since the weekend and has some fullness sensation there.  No fevers or chills.  No injury to the ear that he is aware of. No radiation of symptoms or modifying factors.   Past Medical History:  Diagnosis Date  . Hx MRSA infection    Lt lower leg    Patient Active Problem List   Diagnosis Date Noted  . Cellulitis of right lower leg 01/19/2015  . Cellulitis of lower leg 01/19/2015    History reviewed. No pertinent surgical history.  Allergies Patient has no known allergies.  History reviewed. No pertinent family history.  Social History Social History   Tobacco Use  . Smoking status: Current Every Day Smoker    Packs/day: 0.50    Types: Cigarettes  . Smokeless tobacco: Never Used  Substance Use Topics  . Alcohol use: Yes    Comment: occasional  . Drug use: No    Review of Systems  Constitutional: No fever/chills ENT: No sore throat. Positive left ear fullness and mild pain.  Cardiovascular: Denies chest pain. Respiratory: Denies shortness of breath. Gastrointestinal: No abdominal pain.   Skin: Negative for rash. Neurological: Negative for headaches  ____________________________________________   PHYSICAL EXAM:  VITAL SIGNS: ED Triage Vitals  Enc Vitals Group     BP 12/09/19 0634 (!) 130/96     Pulse Rate 12/09/19 0634 71     Resp 12/09/19 0634 16     Temp 12/09/19 0634 98 F (36.7 C)     Temp Source 12/09/19 0634 Oral     SpO2 12/09/19 0634 100 %      Weight 12/09/19 0633 157 lb (71.2 kg)     Height 12/09/19 0633 5\' 6"  (1.676 m)   Constitutional: Alert and oriented. Well appearing and in no acute distress. Eyes: Conjunctivae are normal.  Head: Atraumatic. Ears: Cerumen impaction of the left ear canal without bleeding or drainage.  Right TM visualized and normal. Nose: No congestion/rhinnorhea. Mouth/Throat: Mucous membranes are moist.  Neck: No stridor.  Cardiovascular: Normal rate, regular rhythm. Respiratory: Normal respiratory effort.   Gastrointestinal: No distention.  Musculoskeletal: No gross deformities of extremities. Neurologic:  Normal speech and language. Skin:  Skin is warm, dry and intact. No rash noted.  ____________________________________________   PROCEDURES  Procedure(s) performed:   Procedures  None  ____________________________________________   INITIAL IMPRESSION / ASSESSMENT AND PLAN / ED COURSE  Pertinent labs & imaging results that were available during my care of the patient were reviewed by me and considered in my medical decision making (see chart for details).   Patient presents with cerumen impaction of the left ear likely causing symptoms.  No evidence of external canal infection.  Plan for Colace and irrigation to try and remove impaction and evaluate TM further. Advised against using cue tips to clean inside ear canal.   TM intact after irrigation. Symptoms improved.  ____________________________________________  FINAL CLINICAL IMPRESSION(S) / ED DIAGNOSES  Final diagnoses:  Impacted cerumen  of left ear     MEDICATIONS GIVEN DURING THIS VISIT:  Medications  docusate (COLACE) 50 MG/5ML liquid 50 mg (50 mg Left EAR Given 12/09/19 1111)      Note:  This document was prepared using Dragon voice recognition software and may include unintentional dictation errors.  Alona Bene, MD, [facility]    Jerre Diguglielmo, Arlyss Repress, MD 12/11/19 [phone]

## 2019-12-09 NOTE — ED Triage Notes (Signed)
Pt states his L ear is "stopped up" and is "a little painful".

## 2019-12-09 NOTE — ED Notes (Signed)
After colace, ear wax was able to be removed via irrigation.

## 2019-12-09 NOTE — Discharge Instructions (Signed)
You were seen in the emergency department today with earwax blocking your left ear.  We attempted to irrigate this out but were unable to remove it.  Please call the ear nose and throat doctor listed as they may need to do procedures in the office to remove this.  Please call the phone number to schedule the next available appointment.  Return to the emergency department any new or suddenly worsening symptoms.  Please do not use a Q-tip to clean inside your canals.

## 2019-12-09 NOTE — ED Notes (Signed)
Very minimal amount of ear wax removed.

## 2019-12-31 DIAGNOSIS — F1020 Alcohol dependence, uncomplicated: Secondary | ICD-10-CM | POA: Diagnosis not present

## 2020-01-01 DIAGNOSIS — F1020 Alcohol dependence, uncomplicated: Secondary | ICD-10-CM | POA: Diagnosis not present

## 2020-01-03 DIAGNOSIS — F1020 Alcohol dependence, uncomplicated: Secondary | ICD-10-CM | POA: Diagnosis not present

## 2020-01-06 DIAGNOSIS — F1020 Alcohol dependence, uncomplicated: Secondary | ICD-10-CM | POA: Diagnosis not present

## 2022-08-07 ENCOUNTER — Observation Stay (HOSPITAL_COMMUNITY)
Admission: EM | Admit: 2022-08-07 | Discharge: 2022-08-09 | Disposition: A | Discharge status: Home / Self Care | Payer: Self-pay | Attending: Family Medicine | Admitting: Family Medicine

## 2022-08-07 ENCOUNTER — Emergency Department (HOSPITAL_COMMUNITY): Payer: BC Managed Care – PPO

## 2022-08-07 ENCOUNTER — Encounter (HOSPITAL_COMMUNITY): Payer: Self-pay | Admitting: Internal Medicine

## 2022-08-07 ENCOUNTER — Other Ambulatory Visit: Payer: Self-pay

## 2022-08-07 DIAGNOSIS — F1010 Alcohol abuse, uncomplicated: Secondary | ICD-10-CM

## 2022-08-07 DIAGNOSIS — R030 Elevated blood-pressure reading, without diagnosis of hypertension: Secondary | ICD-10-CM | POA: Insufficient documentation

## 2022-08-07 DIAGNOSIS — D72829 Elevated white blood cell count, unspecified: Secondary | ICD-10-CM

## 2022-08-07 DIAGNOSIS — K56609 Unspecified intestinal obstruction, unspecified as to partial versus complete obstruction: Principal | ICD-10-CM | POA: Insufficient documentation

## 2022-08-07 DIAGNOSIS — Z79899 Other long term (current) drug therapy: Secondary | ICD-10-CM | POA: Insufficient documentation

## 2022-08-07 DIAGNOSIS — F17210 Nicotine dependence, cigarettes, uncomplicated: Secondary | ICD-10-CM | POA: Insufficient documentation

## 2022-08-07 DIAGNOSIS — Z720 Tobacco use: Secondary | ICD-10-CM | POA: Diagnosis present

## 2022-08-07 LAB — CBC WITH DIFFERENTIAL/PLATELET
Abs Immature Granulocytes: 0.03 10*3/uL (ref 0.00–0.07)
Basophils Absolute: 0 10*3/uL (ref 0.0–0.1)
Basophils Relative: 0 %
Eosinophils Absolute: 0 10*3/uL (ref 0.0–0.5)
Eosinophils Relative: 0 %
HCT: 44.6 % (ref 39.0–52.0)
Hemoglobin: 15.3 g/dL (ref 13.0–17.0)
Immature Granulocytes: 0 %
Lymphocytes Relative: 4 %
Lymphs Abs: 0.4 10*3/uL — ABNORMAL LOW (ref 0.7–4.0)
MCH: 31 pg (ref 26.0–34.0)
MCHC: 34.3 g/dL (ref 30.0–36.0)
MCV: 90.3 fL (ref 80.0–100.0)
Monocytes Absolute: 0.4 10*3/uL (ref 0.1–1.0)
Monocytes Relative: 3 %
Neutro Abs: 11.7 10*3/uL — ABNORMAL HIGH (ref 1.7–7.7)
Neutrophils Relative %: 93 %
Platelets: 241 10*3/uL (ref 150–400)
RBC: 4.94 MIL/uL (ref 4.22–5.81)
RDW: 12.5 % (ref 11.5–15.5)
WBC: 12.6 10*3/uL — ABNORMAL HIGH (ref 4.0–10.5)
nRBC: 0 % (ref 0.0–0.2)

## 2022-08-07 LAB — COMPREHENSIVE METABOLIC PANEL
ALT: 24 U/L (ref 0–44)
AST: 34 U/L (ref 15–41)
Albumin: 4.9 g/dL (ref 3.5–5.0)
Alkaline Phosphatase: 52 U/L (ref 38–126)
Anion gap: 14 (ref 5–15)
BUN: 13 mg/dL (ref 8–23)
CO2: 24 mmol/L (ref 22–32)
Calcium: 10 mg/dL (ref 8.9–10.3)
Chloride: 101 mmol/L (ref 98–111)
Creatinine, Ser: 0.77 mg/dL (ref 0.61–1.24)
GFR, Estimated: >60 mL/min (ref >60)
Glucose, Bld: 122 mg/dL — ABNORMAL HIGH (ref 70–99)
Potassium: 3.8 mmol/L (ref 3.5–5.1)
Sodium: 139 mmol/L (ref 135–145)
Total Bilirubin: 1.2 mg/dL (ref 0.3–1.2)
Total Protein: 8.6 g/dL — ABNORMAL HIGH (ref 6.5–8.1)

## 2022-08-07 LAB — PHOSPHORUS: Phosphorus: 3.9 mg/dL (ref 2.5–4.6)

## 2022-08-07 LAB — LIPASE, BLOOD: Lipase: 31 U/L (ref 11–51)

## 2022-08-07 LAB — MAGNESIUM: Magnesium: 2.1 mg/dL (ref 1.7–2.4)

## 2022-08-07 MED ORDER — ONDANSETRON HCL 4 MG/2ML IJ SOLN: 4.0000 mg | Freq: Four times a day (QID) | INTRAMUSCULAR | Status: DC | PRN

## 2022-08-07 MED ORDER — LORAZEPAM 1 MG PO TABS: 1.0000 mg | ORAL_TABLET | ORAL | Status: DC | PRN

## 2022-08-07 MED ORDER — IOHEXOL 300 MG/ML  SOLN
100.0000 mL | Freq: Once | INTRAMUSCULAR | Status: AC | PRN
  Administered 2022-08-07: 100 mL via INTRAVENOUS

## 2022-08-07 MED ORDER — NICOTINE 7 MG/24HR TD PT24
7.0000 mg | MEDICATED_PATCH | Freq: Every day | TRANSDERMAL | Status: DC
  Administered 2022-08-08 – 2022-08-09 (×2): 7 mg via TRANSDERMAL
  Filled 2022-08-07 (×2): qty 1

## 2022-08-07 MED ORDER — ACETAMINOPHEN 325 MG PO TABS: 650.0000 mg | ORAL_TABLET | Freq: Four times a day (QID) | ORAL | Status: DC | PRN

## 2022-08-07 MED ORDER — THIAMINE MONONITRATE 100 MG PO TABS
100.0000 mg | ORAL_TABLET | Freq: Every day | ORAL | Status: DC
  Administered 2022-08-09: 100 mg via ORAL
  Filled 2022-08-07: qty 1

## 2022-08-07 MED ORDER — LORAZEPAM 2 MG/ML IJ SOLN: 1.0000 mg | INTRAMUSCULAR | Status: DC | PRN

## 2022-08-07 MED ORDER — HYDROMORPHONE HCL 1 MG/ML IJ SOLN
0.5000 mg | Freq: Once | INTRAMUSCULAR | Status: AC
  Administered 2022-08-07: 0.5 mg via INTRAVENOUS
  Filled 2022-08-07: qty 0.5

## 2022-08-07 MED ORDER — KCL IN DEXTROSE-NACL 20-5-0.9 MEQ/L-%-% IV SOLN
INTRAVENOUS | Status: DC
  Filled 2022-08-07 (×3): qty 1000

## 2022-08-07 MED ORDER — SODIUM CHLORIDE 0.9 % IV BOLUS
500.0000 mL | Freq: Once | INTRAVENOUS | Status: AC
  Administered 2022-08-07: 500 mL via INTRAVENOUS

## 2022-08-07 MED ORDER — PANTOPRAZOLE SODIUM 40 MG IV SOLR
40.0000 mg | Freq: Once | INTRAVENOUS | Status: AC
  Administered 2022-08-07: 40 mg via INTRAVENOUS
  Filled 2022-08-07: qty 10

## 2022-08-07 MED ORDER — ONDANSETRON HCL 4 MG/2ML IJ SOLN
4.0000 mg | Freq: Once | INTRAMUSCULAR | Status: AC
  Administered 2022-08-07: 4 mg via INTRAVENOUS
  Filled 2022-08-07: qty 2

## 2022-08-07 MED ORDER — THIAMINE HCL 100 MG/ML IJ SOLN
100.0000 mg | Freq: Every day | INTRAMUSCULAR | Status: DC
  Administered 2022-08-08: 100 mg via INTRAVENOUS
  Filled 2022-08-07: qty 2

## 2022-08-07 MED ORDER — ACETAMINOPHEN 650 MG RE SUPP: 650.0000 mg | Freq: Four times a day (QID) | RECTAL | Status: DC | PRN

## 2022-08-07 MED ORDER — ONDANSETRON HCL 4 MG PO TABS: 4.0000 mg | ORAL_TABLET | Freq: Four times a day (QID) | ORAL | Status: DC | PRN

## 2022-08-07 MED ORDER — HYDROMORPHONE HCL 1 MG/ML IJ SOLN
0.5000 mg | INTRAMUSCULAR | Status: DC | PRN
  Administered 2022-08-07: 0.5 mg via INTRAVENOUS
  Filled 2022-08-07: qty 0.5

## 2022-08-07 NOTE — H&P (Addendum)
History and Physical    KOKI BUXTON ZOX:[id number] DOB: Jan 15, 1961 DOA: 08/07/2022  PCP: Patient, No Pcp Per   Patient coming from: Home  I have personally briefly reviewed patient's old medical records in [facility]  Chief Complaint: Abdominal Pain  HPI: RIDDIK SENNA is a 62 y.o. male with medical history significant for   tobacco and alcohol abuse. Presented to the ED with complaints of upper abdominal pain that started about 6 AM this morning, with associated bloating.  Reports multiple episodes of vomiting since onset.  Reports his bowel movement was about 12 noon today, it was hard/constipated.  No similar episodes.  He has never had surgery abdominal or otherwise.  He does not take any medications.  ED Course: Temp 98.2.  Heart rate 88-100.  Respirate rate 16-24.  Blood pressure systolic 160s to 811. CT abdomen pelvis with contrast-please abdominal dilation of small bowel loops extending to the terminal ileum which demonstrates stool like internal contents of # in the setting of small bowel obstruction.  Possible small bowel obstruction not identified.  He reports his last colonoscopy was about 5 years ago.  Review of Systems: As per HPI all other systems reviewed and negative.  Past Medical History:  Diagnosis Date   Hx MRSA infection    Lt lower leg    No past surgical history on file.   reports that he has been smoking cigarettes. He has been smoking an average of .5 packs per day. He has never used smokeless tobacco. He reports current alcohol use. He reports that he does not use drugs.  No Known Allergies  Family History of hypertension.  Prior to Admission medications   Medication Sig Start Date End Date Taking? Authorizing Provider  amoxicillin-clavulanate (AUGMENTIN) 875-125 MG tablet Take 1 tablet by mouth every 12 (twelve) hours. 10/12/16   Caccavale, Sophia, PA-C  doxycycline (VIBRAMYCIN) 100 MG capsule Take 1 capsule (100 mg total) by mouth 2 (two) times  daily. 01/20/15   Erick Blinks, MD  ibuprofen (ADVIL,MOTRIN) 600 MG tablet Take 1 tablet (600 mg total) by mouth every 6 (six) hours as needed for moderate pain. 01/20/15   Erick Blinks, MD  indomethacin (INDOCIN) 50 MG capsule Take 1 capsule (50 mg total) by mouth 2 (two) times daily with a meal. 12/09/16   Elson Areas, PA-C  predniSONE (DELTASONE) 10 MG tablet 6,5,4,3,2,1 taper 12/09/16   Elson Areas, [place]    Physical Exam: Vitals:   08/07/22 1645 08/07/22 1647 08/07/22 2000  BP:  (!) 177/114 (!) 194/117  Pulse:  93 88  Resp:  20 16  Temp:  98.2 F (36.8 C)   TempSrc:  Oral   SpO2:  100% 96%  Weight: 73.9 kg    Height: 5\' 10"  (1.778 m)      Constitutional: NAD, calm, comfortable Vitals:   08/07/22 1645 08/07/22 1647 08/07/22 2000  BP:  (!) 177/114 (!) 194/117  Pulse:  93 88  Resp:  20 16  Temp:  98.2 F (36.8 C)   TempSrc:  Oral   SpO2:  100% 96%  Weight: 73.9 kg    Height: 5\' 10"  (1.778 m)     Eyes: PERRL, lids and conjunctivae normal ENMT: Mucous membranes are moist.  Neck: normal, supple, no masses, no thyromegaly Respiratory: clear to auscultation bilaterally, no wheezing, no crackles. Normal respiratory effort. No accessory muscle use.  Cardiovascular: Regular rate and rhythm, no murmurs / rubs / gallops. No extremity edema.  Extremities  warm. Abdomen: Distended, mild diffuse tenderness, no masses palpated.  Musculoskeletal: no clubbing / cyanosis. No joint deformity upper and lower extremities.  Skin: no rashes, lesions, ulcers. No induration Neurologic: No facial symmetric, no aphasia, 4+/ 5 strength in all extremities Psychiatric: Normal judgment and insight. Alert and oriented x 3. Normal mood.   Labs on Admission: I have personally reviewed following labs and imaging studies  CBC: Recent Labs  Lab 08/07/22 1714  WBC 12.6*  NEUTROABS 11.7*  HGB 15.3  HCT 44.6  MCV 90.3  PLT 241   Basic Metabolic Panel: Recent Labs  Lab 08/07/22 1714   NA 139  K 3.8  CL 101  CO2 24  GLUCOSE 122*  BUN 13  CREATININE 0.77  CALCIUM 10.0   GFR: Estimated Creatinine Clearance: 98.9 mL/min (by C-G formula based on SCr of 0.77 mg/dL). Liver Function Tests: Recent Labs  Lab 08/07/22 1714  AST 34  ALT 24  ALKPHOS 52  BILITOT 1.2  PROT 8.6*  ALBUMIN 4.9   Recent Labs  Lab 08/07/22 1714  LIPASE 31    Radiological Exams on Admission: CT ABDOMEN PELVIS W CONTRAST  Result Date: 08/07/2022 CLINICAL DATA:  Acute abdominal pain and vomiting, onset this morning EXAM: CT ABDOMEN AND PELVIS WITH CONTRAST TECHNIQUE: Multidetector CT imaging of the abdomen and pelvis was performed using the standard protocol following bolus administration of intravenous contrast. RADIATION DOSE REDUCTION: This exam was performed according to the departmental dose-optimization program which includes automated exposure control, adjustment of the mA and/or kV according to patient size and/or use of iterative reconstruction technique. CONTRAST:  OMNIPAQUE IOHEXOL 300 MG/ML  SOLN COMPARISON:  None Available. FINDINGS: Lower chest: Unremarkable Hepatobiliary: Unremarkable Pancreas: Unremarkable Spleen: The spleen appears unremarkable, however there is perisplenic ascites. Adrenals/Urinary Tract: Contrast in the renal collecting systems is present on initial images, possibly due to a test injection. This lowers sensitivity for nonobstructive calculi. No hydronephrosis observed. There is some mild segmental left distal hydroureter for example on image 69 series 2, past the level of the iliac vessel cross over. No ureteral calculus is identified. Stomach/Bowel: Diffuse abnormal dilation of small bowel loops extending to the terminal ileum which demonstrates stool-like internal contents as can be seen in the setting of small bowel obstruction. Small bowel loops measure up to about 3.7 cm. The colon is of reduced caliber in the somewhat retrocecal appendix appears normal.  The appearance raises suspicion for a small bowel obstruction in the vicinity of the terminal ileum although a specific cause for small bowel obstruction is not identified. There is a small amount of free fluid adjacent to the terminal ileal loops, for example on image 51 series 5. A prominent small bowel ileus is a differential diagnostic consideration but the transition in caliber from the small to the large bowel tends to favor obstruction. No pneumatosis or portal venous gas. No compelling findings of perforation. Scattered mild ascites. Vascular/Lymphatic: Atherosclerosis is present, including aortoiliac atherosclerotic disease. Patent celiac trunk and superior mesenteric artery Reproductive: Unremarkable Other: Scattered mild ascites. Musculoskeletal: Lumbar spondylosis and degenerative disc disease with suspected impingement at L3-4, L4-5, and L5-S1. IMPRESSION: 1. Diffuse abnormal dilation of small bowel loops extending to the terminal ileum which demonstrates stool-like internal contents as can be seen in the setting of small bowel obstruction. The appearance raises suspicion for small bowel obstruction in the vicinity of the terminal ileum although a specific cause for small bowel obstruction is not identified. Scattered mild ascites. Small bowel loops  are dilated up to 3.7 cm; the appendix appears unremarkable. 2. Mild segmental left distal hydroureter past the level of the iliac vessel cross over. No hydronephrosis or ureteral calculus is identified. 3. Lumbar spondylosis and degenerative disc disease with suspected impingement at L3-4, L4-5, and L5-S1. 4. Aortic atherosclerosis. Aortic Atherosclerosis (ICD10-I70.0). Electronically Signed   By: Gaylyn Rong M.D.   On: 08/07/2022 19:01    EKG: Independently reviewed.  Sinus rhythm, rate 75, QTc 476.  No significant change from prior.  Assessment/Plan Principal Problem:   SBO (small bowel obstruction) (HCC) Active Problems:   Tobacco abuse    Alcohol abuse   Elevated blood pressure reading   Assessment and Plan: * SBO (small bowel obstruction) (HCC) Presents with abdominal pain, bloating and vomiting.  Last bowel movement was today- ~12 noon.  CT abdomen and pelvis w contrast-  Diffuse abnormal dilation of small bowel loops extending to the terminal ileum which demonstrates stool-like internal contents as can be seen in the setting of small bowel obstruction. Specific cause for small bowel obstruction is not identified.   - No prior surgery, Reports last colonoscopy was about 5 years ago. No colonoscopy on file or in Care Everywhere - EDP to Dr. Lovell Sheehan will see in consult in a.m. -N.P.O - No vomiting here, hold off on NG tube for now -500 mill bolus given, continue D5 N/s + 20kcl 75cc/hr  -IV Dilaudid 0.5 mg every 4 hourly as needed - Leukocytosis of 12.6, trend for now  Elevated blood pressure reading Blood pressure 160s to 190s, likely from pain.  Denies history of hypertension..  -Monitor for now   Alcohol abuse On average drinks about 3 beers daily.  Denies history of withdrawal or districts.  Last drink was at about 10 PM last night. -CIWA As needed -Mag, Phos - Thiamine - NPO for now, when able- folate multivitamins  Tobacco abuse Smokes almost a pack of cigarettes daily. - Counselled to quit smoking - Nicotine patch   DVT prophylaxis: SCDS- pending gen surg eval in a.m Code Status:  FULL code Family Communication:   Daughter- Ebony at bedside Disposition Plan: ~ 2 days Consults called: Gen Surg Admission status:  Obs tele   Author: Onnie Boer, MD 08/07/2022 9:59 PM  For on call review [email or web address].

## 2022-08-07 NOTE — Assessment & Plan Note (Addendum)
Possibly related to pain. Although blood pressure transiently improved, it worsened again prior to discharge. Asymptomatic. Recommendation for patient to buy a blood pressure cuff, record daily blood pressure readings and follow-up with PCP.

## 2022-08-07 NOTE — Assessment & Plan Note (Addendum)
Presents with abdominal pain, bloating and vomiting.  Last bowel movement was today- ~12 noon.  CT abdomen and pelvis w contrast-  Diffuse abnormal dilation of small bowel loops extending to the terminal ileum which demonstrates stool-like internal contents as can be seen in the setting of small bowel obstruction. Specific cause for small bowel obstruction is not identified.   - No prior surgery, Reports last colonoscopy was about 5 years ago. No colonoscopy on file or in Care Everywhere - EDP to Dr. Lovell Sheehan will see in consult in a.m. -N.P.O - No vomiting here, hold off on NG tube for now -500 mill bolus given, continue D5 N/s + 20kcl 75cc/hr  -IV Dilaudid 0.5 mg every 4 hourly as needed - Leukocytosis of 12.6, trend for now

## 2022-08-07 NOTE — ED Triage Notes (Signed)
Pain c/o abdominal pain and vomiting. Onset this morning. Patient denies urinary symptoms.

## 2022-08-07 NOTE — Assessment & Plan Note (Addendum)
On average drinks about 3 beers daily.  Denies history of withdrawal or districts.  Last drink was at about 10 PM last night. -CIWA As needed -Mag, Phos - Thiamine - NPO for now, when able- folate multivitamins

## 2022-08-07 NOTE — Assessment & Plan Note (Addendum)
Smokes almost a pack of cigarettes daily. - Counselled to quit smoking - Nicotine patch

## 2022-08-08 DIAGNOSIS — D72829 Elevated white blood cell count, unspecified: Secondary | ICD-10-CM

## 2022-08-08 DIAGNOSIS — K56609 Unspecified intestinal obstruction, unspecified as to partial versus complete obstruction: Secondary | ICD-10-CM

## 2022-08-08 LAB — CBC
HCT: 37.7 % — ABNORMAL LOW (ref 39.0–52.0)
Hemoglobin: 13.1 g/dL (ref 13.0–17.0)
MCH: 31.3 pg (ref 26.0–34.0)
MCHC: 34.7 g/dL (ref 30.0–36.0)
MCV: 90 fL (ref 80.0–100.0)
Platelets: 201 10*3/uL (ref 150–400)
RBC: 4.19 MIL/uL — ABNORMAL LOW (ref 4.22–5.81)
RDW: 12.6 % (ref 11.5–15.5)
WBC: 9.2 10*3/uL (ref 4.0–10.5)
nRBC: 0 % (ref 0.0–0.2)

## 2022-08-08 LAB — BASIC METABOLIC PANEL
Anion gap: 9 (ref 5–15)
BUN: 11 mg/dL (ref 8–23)
CO2: 26 mmol/L (ref 22–32)
Calcium: 8.7 mg/dL — ABNORMAL LOW (ref 8.9–10.3)
Chloride: 105 mmol/L (ref 98–111)
Creatinine, Ser: 0.87 mg/dL (ref 0.61–1.24)
GFR, Estimated: >60 mL/min (ref >60)
Glucose, Bld: 117 mg/dL — ABNORMAL HIGH (ref 70–99)
Potassium: 3.7 mmol/L (ref 3.5–5.1)
Sodium: 140 mmol/L (ref 135–145)

## 2022-08-08 LAB — HIV ANTIBODY (ROUTINE TESTING W REFLEX): HIV Screen 4th Generation wRfx: NONREACTIVE

## 2022-08-08 MED ORDER — POLYETHYLENE GLYCOL 3350 17 G PO PACK
17.0000 g | PACK | Freq: Every day | ORAL | Status: DC
  Administered 2022-08-09: 17 g via ORAL
  Filled 2022-08-08: qty 1

## 2022-08-08 MED ORDER — ADULT MULTIVITAMIN W/MINERALS CH
1.0000 | ORAL_TABLET | Freq: Every day | ORAL | Status: DC
  Administered 2022-08-08 – 2022-08-09 (×2): 1 via ORAL
  Filled 2022-08-08 (×2): qty 1

## 2022-08-08 MED ORDER — FOLIC ACID 1 MG PO TABS
1.0000 mg | ORAL_TABLET | Freq: Every day | ORAL | Status: DC
  Administered 2022-08-08 – 2022-08-09 (×2): 1 mg via ORAL
  Filled 2022-08-08 (×2): qty 1

## 2022-08-08 NOTE — Assessment & Plan Note (Signed)
Likely reactive and secondary to current presentation. Reoslved.

## 2022-08-08 NOTE — Consult Note (Signed)
Reason for Consult:small bowel obstruction Referring Physician: Dr. Kennis Carina  Alfred Ponce is an 62 y.o. male.  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 Around 6 AM he started vomiting and couldn't keep any fluids down. He states that the vomit was green. He denies any recent diet changes or travel. He states that he also only had 1 bowel movement yesterday and that his usual bowel frequency is 3x per day. Bowel movement was non-bloody and firm. He denies chest pain, SOB, fever or chills.   Past Medical History:  Diagnosis Date   Hx MRSA infection    Lt lower leg    History reviewed. No pertinent surgical history.  Family Hx: son died of pancreatic cancer  Social History:  reports that he has been smoking cigarettes. He has been smoking an average of .5 packs per day. He has never used smokeless tobacco. He reports current alcohol use. He reports that he does not use drugs.  Allergies: No Known Allergies  Medications: I have reviewed the patient's current medications.  Results for orders placed or performed during the hospital encounter of 08/07/22 (from the past 48 hour(s))  CBC with Differential     Status: Abnormal   Collection Time: 08/07/22  5:14 PM  Result Value Ref Range   WBC 12.6 (H) 4.0 - 10.5 K/uL   RBC 4.94 4.22 - 5.81 MIL/uL   Hemoglobin 15.3 13.0 - 17.0 g/dL   HCT 16.1 09.6 - 04.5 %   MCV 90.3 80.0 - 100.0 fL   MCH 31.0 26.0 - 34.0 pg   MCHC 34.3 30.0 - 36.0 g/dL   RDW 40.9 81.1 - 91.4 %   Platelets 241 150 - 400 K/uL   nRBC 0.0 0.0 - 0.2 %   Neutrophils Relative % 93 %   Neutro Abs 11.7 (H) 1.7 - 7.7 K/uL   Lymphocytes Relative 4 %   Lymphs Abs 0.4 (L) 0.7 - 4.0 K/uL   Monocytes Relative 3 %   Monocytes Absolute 0.4 0.1 - 1.0 K/uL   Eosinophils Relative 0 %   Eosinophils Absolute 0.0 0.0 - 0.5 K/uL   Basophils Relative 0 %   Basophils Absolute 0.0 0.0 - 0.1  K/uL   Immature Granulocytes 0 %   Abs Immature Granulocytes 0.03 0.00 - 0.07 K/uL    Comment: Performed at [facility], [address] [place], [place] [address]  Comprehensive metabolic panel     Status: Abnormal   Collection Time: 08/07/22  5:14 PM  Result Value Ref Range   Sodium 139 135 - 145 mmol/L   Potassium 3.8 3.5 - 5.1 mmol/L   Chloride 101 98 - 111 mmol/L   CO2 24 22 - 32 mmol/L   Glucose, Bld 122 (H) 70 - 99 mg/dL    Comment: Glucose reference range applies only to samples taken after fasting for at least 8 hours.   BUN 13 8 - 23 mg/dL   Creatinine, Ser 6.21 0.61 - 1.24 mg/dL   Calcium 30.8 8.9 - 65.7 mg/dL   Total Protein 8.6 (H) 6.5 - 8.1 g/dL   Albumin 4.9 3.5 - 5.0 g/dL   AST 34 15 - 41 U/L   ALT 24 0 - 44 U/L   Alkaline Phosphatase 52 38 - 126 U/L   Total Bilirubin 1.2 0.3 - 1.2 mg/dL   GFR, Estimated >84 >69 mL/min    Comment: (NOTE) Calculated  using the CKD-EPI Creatinine Equation (2021)    Anion gap 14 5 - 15    Comment: Performed at [facility], [address], [place], [place] [address]  Lipase, blood     Status: None   Collection Time: 08/07/22  5:14 PM  Result Value Ref Range   Lipase 31 11 - 51 U/L    Comment: Performed at [facility], [address] [place], [place] [address]  Magnesium     Status: None   Collection Time: 08/07/22  5:14 PM  Result Value Ref Range   Magnesium 2.1 1.7 - 2.4 mg/dL    Comment: Performed at [facility], [address] [place], [place] [address]  Phosphorus     Status: None   Collection Time: 08/07/22  5:14 PM  Result Value Ref Range   Phosphorus 3.9 2.5 - 4.6 mg/dL    Comment: Performed at [facility], [address] [place], [place] [address]  HIV Antibody (routine testing w rflx)     Status: None   Collection Time: 08/08/22  4:37 AM  Result Value Ref Range   HIV Screen 4th Generation wRfx Non Reactive Non Reactive    Comment: Performed at [facility], [address] [place], [place] [address]  Basic  metabolic panel     Status: Abnormal   Collection Time: 08/08/22  4:37 AM  Result Value Ref Range   Sodium 140 135 - 145 mmol/L   Potassium 3.7 3.5 - 5.1 mmol/L   Chloride 105 98 - 111 mmol/L   CO2 26 22 - 32 mmol/L   Glucose, Bld 117 (H) 70 - 99 mg/dL    Comment: Glucose reference range applies only to samples taken after fasting for at least 8 hours.   BUN 11 8 - 23 mg/dL   Creatinine, Ser 1.30 0.61 - 1.24 mg/dL   Calcium 8.7 (L) 8.9 - 10.3 mg/dL   GFR, Estimated >86 >57 mL/min    Comment: (NOTE) Calculated using the CKD-EPI Creatinine Equation (2021)    Anion gap 9 5 - 15    Comment: Performed at [facility], [address], [place], [place] [address]  CBC     Status: Abnormal   Collection Time: 08/08/22  4:37 AM  Result Value Ref Range   WBC 9.2 4.0 - 10.5 K/uL   RBC 4.19 (L) 4.22 - 5.81 MIL/uL   Hemoglobin 13.1 13.0 - 17.0 g/dL   HCT 29.5 (L) 28.4 - 13.2 %   MCV 90.0 80.0 - 100.0 fL   MCH 31.3 26.0 - 34.0 pg   MCHC 34.7 30.0 - 36.0 g/dL   RDW 44.0 10.2 - 72.5 %   Platelets 201 150 - 400 K/uL   nRBC 0.0 0.0 - 0.2 %    Comment: Performed at [facility], [address], [place], [place] [address]    CT ABDOMEN PELVIS W CONTRAST  Result Date: 08/07/2022 CLINICAL DATA:  Acute abdominal pain and vomiting, onset this morning EXAM: CT ABDOMEN AND PELVIS WITH CONTRAST TECHNIQUE: Multidetector CT imaging of the abdomen and pelvis was performed using the standard protocol following bolus administration of intravenous contrast. RADIATION DOSE REDUCTION: This exam was performed according to the departmental dose-optimization program which includes automated exposure control, adjustment of the mA and/or kV according to patient size and/or use of iterative reconstruction technique. CONTRAST:  OMNIPAQUE IOHEXOL 300 MG/ML  SOLN COMPARISON:  None Available. FINDINGS: Lower chest: Unremarkable Hepatobiliary: Unremarkable Pancreas: Unremarkable Spleen: The spleen appears unremarkable,  however there is perisplenic ascites. Adrenals/Urinary Tract: Contrast in the renal collecting systems is present on initial images, possibly due to a test injection. This lowers sensitivity for nonobstructive calculi. No hydronephrosis observed. There is some mild segmental left distal hydroureter for example on image 69 series 2, past the level of the iliac vessel cross over. No ureteral calculus is identified. Stomach/Bowel: Diffuse abnormal dilation of small bowel loops extending to the terminal ileum which demonstrates stool-like internal contents as can be seen in the setting of small bowel obstruction. Small bowel loops measure up to about 3.7 cm. The colon is of reduced caliber in the somewhat retrocecal appendix appears normal. The appearance raises suspicion for a small bowel obstruction in the vicinity of the terminal ileum although a specific cause for small bowel obstruction is not identified. There is a small amount of free fluid adjacent to the terminal ileal loops, for example on image 51 series 5. A prominent small bowel ileus is a differential diagnostic consideration but the transition in caliber from the small to the large bowel tends to favor obstruction. No pneumatosis or portal venous gas. No compelling findings of perforation. Scattered mild ascites. Vascular/Lymphatic: Atherosclerosis is present, including aortoiliac atherosclerotic disease. Patent celiac trunk and superior mesenteric artery Reproductive: Unremarkable Other: Scattered mild ascites. Musculoskeletal: Lumbar spondylosis and degenerative disc disease with suspected impingement at L3-4, L4-5, and L5-S1. IMPRESSION: 1. Diffuse abnormal dilation of small bowel loops extending to the terminal ileum which demonstrates stool-like internal contents as can be seen in the setting of small bowel obstruction. The appearance raises suspicion for small bowel obstruction in the vicinity of the terminal ileum although a specific cause for  small bowel obstruction is not identified. Scattered mild ascites. Small bowel loops are dilated up to 3.7 cm; the appendix appears unremarkable. 2. Mild segmental left distal hydroureter past the level of the iliac vessel cross over. No hydronephrosis or ureteral calculus is identified. 3. Lumbar spondylosis and degenerative disc disease with suspected impingement at L3-4, L4-5, and L5-S1. 4. Aortic atherosclerosis. Aortic Atherosclerosis (ICD10-I70.0). Electronically Signed   By: Gaylyn Rong M.D.   On: 08/07/2022 19:01    ROS:  Pertinent items noted in HPI and remainder of comprehensive ROS otherwise negative.  Blood pressure 129/82, pulse 93, temperature 98.3 F (36.8 C), temperature source Oral, resp. rate 18, height 5\' 6"  (1.676 m), weight 64.8 kg, SpO2 99 %. Physical Exam:   Constitutional: comfortably laying in bed Abdominal: soft, nontender, mildly distended. Active bowel sounds appreciated in all 4 quadrants Respiratory: normal work of breathing on room air. Clear to auscultation bilaterally. Cardiovascular: Normal S1 and S2. RRR. No murmurs, rubs, gallops. DP and brachial pulses 2+. No LE edema. Skin: warm and dry  Assessment/Plan:  #SBO Alfred Ponce states that he has had no abdominal pain or vomiting since admission. I think he is well controlled on his current pain regimen. His vital signs are stable, he is currently afebrile with normal BP and HR. No acute indication for surgery at this time, especially supported by his CT being negative for perforation or other complications. I think his SBO is resolving which is supported by his bowel movement this morning as well as him endorsing reduced abdominal distension this morning. Will continue to monitor him conservatively by advancing his diet as tolerated per hospitalist team.   Oscar La 08/08/2022, 11:08 AM

## 2022-08-08 NOTE — Hospital Course (Signed)
Alfred Ponce is a 62 y.o. male with a history of tobacco and alcohol abuse. Patient presented secondary to upper abdominal pain and found to have evidence of possible bowel obstruction on CT scan. Patient started on bowel rest and general surgery consulted.Conservative management. Diet advanced and patient had multiple bowel movements prior to discharge home. Recommendations for general surgery follow-up as needed and GI follow-up for colonoscopy.

## 2022-08-08 NOTE — TOC Initial Note (Signed)
Transition of Care [facility]) - Initial/Assessment Note    Patient Details  Name: Alfred Ponce MRN: [id number] Date of Birth: 1960-05-11  Transition of [facility]) CM/SW Contact:    Villa Herb, LCSWA Phone Number: 08/08/2022, 9:25 AM  Clinical Narrative:                 Pt arriving from home. CSW noted per chart review that pt does not have insurance or PCP on file. CSW spoke with pt about interest in [facility] referral being made on his behalf. Pt is interested. CSW made referral to [facility] and they will follow up with pt at D/C.   Expected Discharge Plan: Home/Self Care Barriers to Discharge: Barriers Resolved   Patient Goals and CMS Choice Patient states their goals for this hospitalization and ongoing recovery are:: return home CMS Medicare.gov Compare Post Acute Care list provided to:: Patient Choice offered to / list presented to : Patient      Expected Discharge Plan and Services In-house Referral: Clinical Social Work Discharge Planning Services: CM Consult   Living arrangements for the past 2 months: Single Family Home                                      Prior Living Arrangements/Services Living arrangements for the past 2 months: Single Family Home Lives with:: Self Patient language and need for interpreter reviewed:: Yes Do you feel safe going back to the place where you live?: Yes      Need for Family Participation in Patient Care: Yes (Comment) Care giver support system in place?: Yes (comment)   Criminal Activity/Legal Involvement Pertinent to Current Situation/Hospitalization: No - Comment as needed  Activities of Daily Living Home Assistive Devices/Equipment: Dentures (specify type), Other (Comment) (Dentures upper, partial lower.) ADL Screening (condition at time of admission) Patient's cognitive ability adequate to safely complete daily activities?: Yes Is the patient deaf or have difficulty hearing?: No Does the patient have  difficulty seeing, even when wearing glasses/contacts?: No Does the patient have difficulty concentrating, remembering, or making decisions?: No Patient able to express need for assistance with ADLs?: Yes Does the patient have difficulty dressing or bathing?: No Independently performs ADLs?: Yes (appropriate for developmental age) Does the patient have difficulty walking or climbing stairs?: No Weakness of Legs: None Weakness of Arms/Hands: None  Permission Sought/Granted                  Emotional Assessment Appearance:: Appears stated age Attitude/Demeanor/Rapport: Engaged Affect (typically observed): Accepting Orientation: : Oriented to Self, Oriented to Place, Oriented to  Time, Oriented to Situation Alcohol / Substance Use: Not Applicable Psych Involvement: No (comment)  Admission diagnosis:  SBO (small bowel obstruction) (HCC) [K56.609] Patient Active Problem List   Diagnosis Date Noted   SBO (small bowel obstruction) (HCC) 08/07/2022   Tobacco abuse 08/07/2022   Alcohol abuse 08/07/2022   Elevated blood pressure reading 08/07/2022   Cellulitis of right lower leg 01/19/2015   Cellulitis of lower leg 01/19/2015   PCP:  Patient, No Pcp Per Pharmacy:   [id number] - [place], [place] - [address] [place] [address] Phone: [phone] Fax: [phone]     Social Determinants of Health (SDOH) Social History: SDOH Screenings   Food Insecurity: No Food Insecurity (08/07/2022)  Housing:  Low Risk  (08/07/2022)  Transportation Needs: No Transportation Needs (08/07/2022)  Utilities: Not At Risk (08/07/2022)  Tobacco Use: High Risk (08/07/2022)   SDOH Interventions:     Readmission Risk Interventions     No data to display

## 2022-08-08 NOTE — Progress Notes (Signed)
PROGRESS NOTE    SAEED TOREN  UJW:[id number] DOB: 1960/09/06 DOA: 08/07/2022 PCP: Patient, No Pcp Per   Brief Narrative: MAXIMOS ZAYAS is a 62 y.o. male with a history of tobacco and alcohol abuse. Patient presented secondary to upper abdominal pain and found to have evidence of possible bowel obstruction on CT scan. Patient started on bowel rest and general surgery consulted.Conservative management. Advancing diet as tolerated.   Assessment and Plan: * SBO (small bowel obstruction) (HCC) Concern noted on CT abdomen/pelvis. No overt transition point. Patient without history of prior abdominal surgery. General surgery consulted and are recommending continued conservative management. Patient advanced to a clear liquid diet. -Continue general surgery recommendations -Clear liquid diet -Watch for bowel movement  Elevated blood pressure reading Likely related to pain. Blood pressure improved today.  Alcohol abuse -Continue CIWA -Continue thiamine -Start multivitamin and folic acid  Tobacco abuse Counseled on admission. -Continue nicotine patch  Leukocytosis-resolved as of 08/08/2022 Likely reactive and secondary to current presentation. Reoslved.     DVT prophylaxis: SCDs Code Status:   Code Status: Full Code Family Communication: Wife at bedside Disposition Plan: Discharge home likely in 24 hours if continues to tolerate diet advancement   Consultants:  General surgery  Procedures:  None  Antimicrobials: None    Subjective: Patient reports passing gas but no bowel movement. No nausea or vomiting. Abdominal pain is improved.  Objective: BP 129/82 (BP Location: Left Arm)   Pulse 93   Temp 98.3 F (36.8 C) (Oral)   Resp 18   Ht 5\' 6"  (1.676 m)   Wt 64.8 kg   SpO2 99%   BMI 23.06 kg/m   Examination:  General exam: Appears calm and comfortable Respiratory system: Clear to auscultation. Respiratory effort normal. Cardiovascular system: S1 & S2 heard,  RRR. No murmurs, rubs, gallops or clicks. Gastrointestinal system: Abdomen is distended, soft and non-tender. Normal bowel sounds heard. Central nervous system: Alert and oriented. No focal neurological deficits. Musculoskeletal: No edema. No calf tenderness Skin: No cyanosis. No rashes Psychiatry: Judgement and insight appear normal. Mood & affect appropriate.    Data Reviewed: I have personally reviewed following labs and imaging studies  CBC Lab Results  Component Value Date   WBC 9.2 08/08/2022   RBC 4.19 (L) 08/08/2022   HGB 13.1 08/08/2022   HCT 37.7 (L) 08/08/2022   MCV 90.0 08/08/2022   MCH 31.3 08/08/2022   PLT 201 08/08/2022   MCHC 34.7 08/08/2022   RDW 12.6 08/08/2022   LYMPHSABS 0.4 (L) 08/07/2022   MONOABS 0.4 08/07/2022   EOSABS 0.0 08/07/2022   BASOSABS 0.0 08/07/2022     Last metabolic panel Lab Results  Component Value Date   NA 140 08/08/2022   K 3.7 08/08/2022   CL 105 08/08/2022   CO2 26 08/08/2022   BUN 11 08/08/2022   CREATININE 0.87 08/08/2022   GLUCOSE 117 (H) 08/08/2022   GFRNONAA >60 08/08/2022   GFRAA >60 01/20/2015   CALCIUM 8.7 (L) 08/08/2022   PHOS 3.9 08/07/2022   PROT 8.6 (H) 08/07/2022   ALBUMIN 4.9 08/07/2022   BILITOT 1.2 08/07/2022   ALKPHOS 52 08/07/2022   AST 34 08/07/2022   ALT 24 08/07/2022   ANIONGAP 9 08/08/2022    GFR: Estimated Creatinine Clearance: 79.4 mL/min (by C-G formula based on SCr of 0.87 mg/dL).  No results found for this or any previous visit (from the past 240 hour(s)).    Radiology Studies: CT ABDOMEN PELVIS W CONTRAST  Result Date: 08/07/2022 CLINICAL DATA:  Acute abdominal pain and vomiting, onset this morning EXAM: CT ABDOMEN AND PELVIS WITH CONTRAST TECHNIQUE: Multidetector CT imaging of the abdomen and pelvis was performed using the standard protocol following bolus administration of intravenous contrast. RADIATION DOSE REDUCTION: This exam was performed according to the departmental  dose-optimization program which includes automated exposure control, adjustment of the mA and/or kV according to patient size and/or use of iterative reconstruction technique. CONTRAST:  OMNIPAQUE IOHEXOL 300 MG/ML  SOLN COMPARISON:  None Available. FINDINGS: Lower chest: Unremarkable Hepatobiliary: Unremarkable Pancreas: Unremarkable Spleen: The spleen appears unremarkable, however there is perisplenic ascites. Adrenals/Urinary Tract: Contrast in the renal collecting systems is present on initial images, possibly due to a test injection. This lowers sensitivity for nonobstructive calculi. No hydronephrosis observed. There is some mild segmental left distal hydroureter for example on image 69 series 2, past the level of the iliac vessel cross over. No ureteral calculus is identified. Stomach/Bowel: Diffuse abnormal dilation of small bowel loops extending to the terminal ileum which demonstrates stool-like internal contents as can be seen in the setting of small bowel obstruction. Small bowel loops measure up to about 3.7 cm. The colon is of reduced caliber in the somewhat retrocecal appendix appears normal. The appearance raises suspicion for a small bowel obstruction in the vicinity of the terminal ileum although a specific cause for small bowel obstruction is not identified. There is a small amount of free fluid adjacent to the terminal ileal loops, for example on image 51 series 5. A prominent small bowel ileus is a differential diagnostic consideration but the transition in caliber from the small to the large bowel tends to favor obstruction. No pneumatosis or portal venous gas. No compelling findings of perforation. Scattered mild ascites. Vascular/Lymphatic: Atherosclerosis is present, including aortoiliac atherosclerotic disease. Patent celiac trunk and superior mesenteric artery Reproductive: Unremarkable Other: Scattered mild ascites. Musculoskeletal: Lumbar spondylosis and degenerative disc disease  with suspected impingement at L3-4, L4-5, and L5-S1. IMPRESSION: 1. Diffuse abnormal dilation of small bowel loops extending to the terminal ileum which demonstrates stool-like internal contents as can be seen in the setting of small bowel obstruction. The appearance raises suspicion for small bowel obstruction in the vicinity of the terminal ileum although a specific cause for small bowel obstruction is not identified. Scattered mild ascites. Small bowel loops are dilated up to 3.7 cm; the appendix appears unremarkable. 2. Mild segmental left distal hydroureter past the level of the iliac vessel cross over. No hydronephrosis or ureteral calculus is identified. 3. Lumbar spondylosis and degenerative disc disease with suspected impingement at L3-4, L4-5, and L5-S1. 4. Aortic atherosclerosis. Aortic Atherosclerosis (ICD10-I70.0). Electronically Signed   By: Gaylyn Rong M.D.   On: 08/07/2022 19:01      LOS: 0 days    Jacquelin Hawking, MD [facility] 08/08/2022, 11:12 AM   If 7PM-7AM, please contact night-coverage [email or web address]

## 2022-08-09 NOTE — Progress Notes (Signed)
Subjective: Patient's abdominal pain has resolved.  He has had multiple bowel movements.  Tolerated regular diet well this morning.  Objective: Vital signs in last 24 hours: Temp:  [97.9 F (36.6 C)] 97.9 F (36.6 C) (04/29 1234) Pulse Rate:  [65-83] 70 (04/30 0505) Resp:  [20] 20 (04/30 0505) BP: (142-154)/(97-106) 154/100 (04/30 0505) SpO2:  [100 %] 100 % (04/30 0505) Last BM Date : 08/08/22  Intake/Output from previous day: 04/29 0701 - 04/30 0700 In: 1780 [P.O.:1780] Out: -  Intake/Output this shift: No intake/output data recorded.  General appearance: alert, cooperative, and no distress GI: soft, non-tender; bowel sounds normal; no masses,  no organomegaly  Lab Results:  Recent Labs    08/07/22 1714 08/08/22 0437  WBC 12.6* 9.2  HGB 15.3 13.1  HCT 44.6 37.7*  PLT 241 201   BMET Recent Labs    08/07/22 1714 08/08/22 0437  NA 139 140  K 3.8 3.7  CL 101 105  CO2 24 26  GLUCOSE 122* 117*  BUN 13 11  CREATININE 0.77 0.87  CALCIUM 10.0 8.7*   PT/INR No results for input(s): "LABPROT", "INR" in the last 72 hours.  Studies/Results: CT ABDOMEN PELVIS W CONTRAST  Result Date: 08/07/2022 CLINICAL DATA:  Acute abdominal pain and vomiting, onset this morning EXAM: CT ABDOMEN AND PELVIS WITH CONTRAST TECHNIQUE: Multidetector CT imaging of the abdomen and pelvis was performed using the standard protocol following bolus administration of intravenous contrast. RADIATION DOSE REDUCTION: This exam was performed according to the departmental dose-optimization program which includes automated exposure control, adjustment of the mA and/or kV according to patient size and/or use of iterative reconstruction technique. CONTRAST:  OMNIPAQUE IOHEXOL 300 MG/ML  SOLN COMPARISON:  None Available. FINDINGS: Lower chest: Unremarkable Hepatobiliary: Unremarkable Pancreas: Unremarkable Spleen: The spleen appears unremarkable, however there is perisplenic ascites. Adrenals/Urinary  Tract: Contrast in the renal collecting systems is present on initial images, possibly due to a test injection. This lowers sensitivity for nonobstructive calculi. No hydronephrosis observed. There is some mild segmental left distal hydroureter for example on image 69 series 2, past the level of the iliac vessel cross over. No ureteral calculus is identified. Stomach/Bowel: Diffuse abnormal dilation of small bowel loops extending to the terminal ileum which demonstrates stool-like internal contents as can be seen in the setting of small bowel obstruction. Small bowel loops measure up to about 3.7 cm. The colon is of reduced caliber in the somewhat retrocecal appendix appears normal. The appearance raises suspicion for a small bowel obstruction in the vicinity of the terminal ileum although a specific cause for small bowel obstruction is not identified. There is a small amount of free fluid adjacent to the terminal ileal loops, for example on image 51 series 5. A prominent small bowel ileus is a differential diagnostic consideration but the transition in caliber from the small to the large bowel tends to favor obstruction. No pneumatosis or portal venous gas. No compelling findings of perforation. Scattered mild ascites. Vascular/Lymphatic: Atherosclerosis is present, including aortoiliac atherosclerotic disease. Patent celiac trunk and superior mesenteric artery Reproductive: Unremarkable Other: Scattered mild ascites. Musculoskeletal: Lumbar spondylosis and degenerative disc disease with suspected impingement at L3-4, L4-5, and L5-S1. IMPRESSION: 1. Diffuse abnormal dilation of small bowel loops extending to the terminal ileum which demonstrates stool-like internal contents as can be seen in the setting of small bowel obstruction. The appearance raises suspicion for small bowel obstruction in the vicinity of the terminal ileum although a specific cause for small bowel  obstruction is not identified. Scattered mild  ascites. Small bowel loops are dilated up to 3.7 cm; the appendix appears unremarkable. 2. Mild segmental left distal hydroureter past the level of the iliac vessel cross over. No hydronephrosis or ureteral calculus is identified. 3. Lumbar spondylosis and degenerative disc disease with suspected impingement at L3-4, L4-5, and L5-S1. 4. Aortic atherosclerosis. Aortic Atherosclerosis (ICD10-I70.0). Electronically Signed   By: Gaylyn Rong M.D.   On: 08/07/2022 19:01    Anti-infectives: Anti-infectives (From admission, onward)    None       Assessment/Plan: Impression: Small bowel obstruction/ileus resolved.  No need for surgical intervention at the present time.  Tolerating regular diet well. Plan: Okay for discharge from surgery standpoint.  Patient may follow-up with [place] as needed.  He was encouraged to get a colonoscopy as an outpatient.  LOS: 0 days    Franky Macho 08/09/2022

## 2022-08-09 NOTE — Final Consult Note (Signed)
Reason for Consult:SBO Referring Physician:  Dr. Kennis Carina   Alfred Ponce is an 62 y.o. male.   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   There were no acute overnight events - patient slept well and did not have any further episodes of N/V since 4/28. He states that he was able to eat solid foods this morning. He has had another bowel movement overnight. He denies abdominal pain, SOB, fever or chills.   Past Medical History:  Diagnosis Date   Hx MRSA infection    Lt lower leg    History reviewed. No pertinent surgical history.  History reviewed. No pertinent family history.  Social History:  reports that he has been smoking cigarettes. He has been smoking an average of .5 packs per day. He has never used smokeless tobacco. He reports current alcohol use. He reports that he does not use drugs.  Allergies: No Known Allergies  Medications: I have reviewed the patient's current medications.  Results for orders placed or performed during the hospital encounter of 08/07/22 (from the past 48 hour(s))  CBC with Differential     Status: Abnormal   Collection Time: 08/07/22  5:14 PM  Result Value Ref Range   WBC 12.6 (H) 4.0 - 10.5 K/uL   RBC 4.94 4.22 - 5.81 MIL/uL   Hemoglobin 15.3 13.0 - 17.0 g/dL   HCT 86.5 78.4 - 69.6 %   MCV 90.3 80.0 - 100.0 fL   MCH 31.0 26.0 - 34.0 pg   MCHC 34.3 30.0 - 36.0 g/dL   RDW 29.5 28.4 - 13.2 %   Platelets 241 150 - 400 K/uL   nRBC 0.0 0.0 - 0.2 %   Neutrophils Relative % 93 %   Neutro Abs 11.7 (H) 1.7 - 7.7 K/uL   Lymphocytes Relative 4 %   Lymphs Abs 0.4 (L) 0.7 - 4.0 K/uL   Monocytes Relative 3 %   Monocytes Absolute 0.4 0.1 - 1.0 K/uL   Eosinophils Relative 0 %   Eosinophils Absolute 0.0 0.0 - 0.5 K/uL   Basophils Relative 0 %   Basophils Absolute 0.0 0.0 - 0.1 K/uL   Immature Granulocytes 0 %   Abs Immature Granulocytes 0.03 0.00 - 0.07 K/uL     Comment: Performed at [facility], [address], [place], [place] [address]  Comprehensive metabolic panel     Status: Abnormal   Collection Time: 08/07/22  5:14 PM  Result Value Ref Range   Sodium 139 135 - 145 mmol/L   Potassium 3.8 3.5 - 5.1 mmol/L   Chloride 101 98 - 111 mmol/L   CO2 24 22 - 32 mmol/L   Glucose, Bld 122 (H) 70 - 99 mg/dL    Comment: Glucose reference range applies only to samples taken after fasting for at least 8 hours.   BUN 13 8 - 23 mg/dL   Creatinine, Ser 2.72 0.61 - 1.24 mg/dL   Calcium 53.6 8.9 - 64.4 mg/dL   Total Protein 8.6 (H) 6.5 - 8.1 g/dL   Albumin 4.9 3.5 - 5.0 g/dL   AST 34 15 - 41 U/L   ALT 24 0 - 44 U/L   Alkaline Phosphatase 52 38 - 126 U/L   Total Bilirubin 1.2 0.3 - 1.2 mg/dL   GFR, Estimated >03 >47 mL/min    Comment: (NOTE) Calculated using the CKD-EPI Creatinine Equation (2021)    Anion gap 14  5 - 15    Comment: Performed at [facility], [address] [place], [place] [address]  Lipase, blood     Status: None   Collection Time: 08/07/22  5:14 PM  Result Value Ref Range   Lipase 31 11 - 51 U/L    Comment: Performed at [facility], [address], [place], [place] [address]  Magnesium     Status: None   Collection Time: 08/07/22  5:14 PM  Result Value Ref Range   Magnesium 2.1 1.7 - 2.4 mg/dL    Comment: Performed at [facility], [address], [place], [place] [address]  Phosphorus     Status: None   Collection Time: 08/07/22  5:14 PM  Result Value Ref Range   Phosphorus 3.9 2.5 - 4.6 mg/dL    Comment: Performed at [facility], [address], [place], [place] [address]  HIV Antibody (routine testing w rflx)     Status: None   Collection Time: 08/08/22  4:37 AM  Result Value Ref Range   HIV Screen 4th Generation wRfx Non Reactive Non Reactive    Comment: Performed at [facility], [address], [place], [place] [address]  Basic metabolic panel     Status: Abnormal   Collection Time: 08/08/22  4:37 AM  Result  Value Ref Range   Sodium 140 135 - 145 mmol/L   Potassium 3.7 3.5 - 5.1 mmol/L   Chloride 105 98 - 111 mmol/L   CO2 26 22 - 32 mmol/L   Glucose, Bld 117 (H) 70 - 99 mg/dL    Comment: Glucose reference range applies only to samples taken after fasting for at least 8 hours.   BUN 11 8 - 23 mg/dL   Creatinine, Ser 1.30 0.61 - 1.24 mg/dL   Calcium 8.7 (L) 8.9 - 10.3 mg/dL   GFR, Estimated >86 >57 mL/min    Comment: (NOTE) Calculated using the CKD-EPI Creatinine Equation (2021)    Anion gap 9 5 - 15    Comment: Performed at [facility], [address], [place], [place] [address]  CBC     Status: Abnormal   Collection Time: 08/08/22  4:37 AM  Result Value Ref Range   WBC 9.2 4.0 - 10.5 K/uL   RBC 4.19 (L) 4.22 - 5.81 MIL/uL   Hemoglobin 13.1 13.0 - 17.0 g/dL   HCT 29.5 (L) 28.4 - 13.2 %   MCV 90.0 80.0 - 100.0 fL   MCH 31.3 26.0 - 34.0 pg   MCHC 34.7 30.0 - 36.0 g/dL   RDW 44.0 10.2 - 72.5 %   Platelets 201 150 - 400 K/uL   nRBC 0.0 0.0 - 0.2 %    Comment: Performed at [facility], [address], [place], [place] [address]    CT ABDOMEN PELVIS W CONTRAST  Result Date: 08/07/2022 CLINICAL DATA:  Acute abdominal pain and vomiting, onset this morning EXAM: CT ABDOMEN AND PELVIS WITH CONTRAST TECHNIQUE: Multidetector CT imaging of the abdomen and pelvis was performed using the standard protocol following bolus administration of intravenous contrast. RADIATION DOSE REDUCTION: This exam was performed according to the departmental dose-optimization program which includes automated exposure control, adjustment of the mA and/or kV according to patient size and/or use of iterative reconstruction technique. CONTRAST:  OMNIPAQUE IOHEXOL 300 MG/ML  SOLN COMPARISON:  None Available. FINDINGS: Lower chest: Unremarkable Hepatobiliary: Unremarkable Pancreas: Unremarkable Spleen: The spleen appears unremarkable, however there is perisplenic ascites. Adrenals/Urinary Tract: Contrast in the renal  collecting systems is present on initial images, possibly due to a test injection. This lowers sensitivity for nonobstructive calculi. No hydronephrosis observed. There is some mild segmental left distal hydroureter for example on image 69 series 2, past the level of the iliac vessel cross over. No ureteral calculus is identified. Stomach/Bowel: Diffuse abnormal dilation of small bowel loops extending to the terminal ileum which demonstrates stool-like internal contents as can be seen in the setting of small bowel obstruction. Small bowel loops measure up to about 3.7 cm. The colon is of reduced caliber in the somewhat retrocecal appendix appears normal. The appearance raises suspicion for a small bowel obstruction in the vicinity of the terminal ileum although a specific cause for small bowel obstruction is not identified. There is a small amount of free fluid adjacent to the terminal ileal loops, for example on image 51 series 5. A prominent small bowel ileus is a differential diagnostic consideration but the transition in caliber from the small to the large bowel tends to favor obstruction. No pneumatosis or portal venous gas. No compelling findings of perforation. Scattered mild ascites. Vascular/Lymphatic: Atherosclerosis is present, including aortoiliac atherosclerotic disease. Patent celiac trunk and superior mesenteric artery Reproductive: Unremarkable Other: Scattered mild ascites. Musculoskeletal: Lumbar spondylosis and degenerative disc disease with suspected impingement at L3-4, L4-5, and L5-S1. IMPRESSION: 1. Diffuse abnormal dilation of small bowel loops extending to the terminal ileum which demonstrates stool-like internal contents as can be seen in the setting of small bowel obstruction. The appearance raises suspicion for small bowel obstruction in the vicinity of the terminal ileum although a specific cause for small bowel obstruction is not identified. Scattered mild ascites. Small bowel loops are  dilated up to 3.7 cm; the appendix appears unremarkable. 2. Mild segmental left distal hydroureter past the level of the iliac vessel cross over. No hydronephrosis or ureteral calculus is identified. 3. Lumbar spondylosis and degenerative disc disease with suspected impingement at L3-4, L4-5, and L5-S1. 4. Aortic atherosclerosis. Aortic Atherosclerosis (ICD10-I70.0). Electronically Signed   By: Gaylyn Rong M.D.   On: 08/07/2022 19:01    ROS:  Pertinent items noted in HPI and remainder of comprehensive ROS otherwise negative.  Blood pressure (!) 154/100, pulse 70, temperature 97.9 F (36.6 C), temperature source Oral, resp. rate 20, height 5\' 6"  (1.676 m), weight 64.8 kg, SpO2 100 %. Physical Exam HENT:     Head: Normocephalic.     Mouth/Throat:     Mouth: Mucous membranes are moist.  Cardiovascular:     Rate and Rhythm: Normal rate and regular rhythm.     Heart sounds: Normal heart sounds.  Pulmonary:     Effort: Pulmonary effort is normal.  Abdominal:     General: Bowel sounds are normal. There is distension.     Palpations: Abdomen is soft.  Skin:    General: Skin is warm and dry.  Neurological:     Mental Status: He is alert and oriented to person, place, and time.  Psychiatric:        Mood and Affect: Mood normal.      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  #SBO Patient ad his diet advanced from clear liquids to solid food in the past 24 hours without complication. He has endorsed no N/V episodes in the last two days and does not have abdominal pain. He also has regular bowel movements, which  is reassuring that his SBO is resolving well. His vital signs remain stable. From a surgical standpoint, he does not need acute intervention at this time. I anticipate that he will be able to discharge later today pending the recommendation of the hospitalists  and/or any further workup needed.  Tiarah Shisler L Sundi Slevin 08/09/2022, 9:30 AM

## 2022-08-09 NOTE — Discharge Instructions (Addendum)
Alfred Ponce,  You were in the hospital with abdominal pain and evidence of a bowel obstruction. Thankfully, this appears to have been a very mild and transient case. You have improved with conservative management. Please follow-up with Dr. Lovell Sheehan as needed.

## 2022-08-09 NOTE — Discharge Summary (Signed)
Physician Discharge Summary   Patient: Alfred Ponce MRN: [id number] DOB: 1960/12/01  Admit date:     08/07/2022  Discharge date: 08/09/22  Discharge Physician: Jacquelin Hawking, MD   PCP: Patient, No Pcp Per   Recommendations at discharge:  PCP, General surgery and GI follow-up  Discharge Diagnoses: Principal Problem:   SBO (small bowel obstruction) (HCC) Active Problems:   Tobacco abuse   Alcohol abuse   Elevated blood pressure reading  Resolved Problems:   Leukocytosis  Hospital Course: Alfred Ponce is a 62 y.o. male with a history of tobacco and alcohol abuse. Patient presented secondary to upper abdominal pain and found to have evidence of possible bowel obstruction on CT scan. Patient started on bowel rest and general surgery consulted.Conservative management. Diet advanced and patient had multiple bowel movements prior to discharge home. Recommendations for general surgery follow-up as needed and GI follow-up for colonoscopy.  Assessment and Plan: * SBO (small bowel obstruction) (HCC) Concern noted on CT abdomen/pelvis. No overt transition point. Patient without history of prior abdominal surgery. General surgery consulted and are recommending continued conservative management. Patient advanced to a a regular diet. Patient with multiple bowel movements prior to discharge.  Elevated blood pressure reading Possibly related to pain. Although blood pressure transiently improved, it worsened again prior to discharge. Asymptomatic. Recommendation for patient to buy a blood pressure cuff, record daily blood pressure readings and follow-up with PCP.  Alcohol abuse CIWA started while inpatient. No withdrawal symptoms noted.  Tobacco abuse Counseled on admission.  Leukocytosis-resolved as of 08/08/2022 Likely reactive and secondary to current presentation. Reoslved.     Consultants: General surgery Procedures performed: None  Disposition: Home Diet recommendation: Regular  diet   DISCHARGE MEDICATION: Allergies as of 08/09/2022   No Known Allergies      Medication List    You have not been prescribed any medications.     Follow-up Information     Franky Macho, MD Follow up.   Specialty: General Surgery Why: As needed Contact information: [address] [place] [address] [phone]         Dolores Frame, MD. Schedule an appointment as soon as possible for a visit.   Specialty: Gastroenterology Why: Colonoscopy Contact information: [address] [place] [address] [phone]                Discharge Exam: BP (!) 154/100   Pulse 70   Temp 97.9 F (36.6 C) (Oral)   Resp 20   Ht 5\' 6"  (1.676 m)   Wt 64.8 kg   SpO2 100%   BMI 23.06 kg/m   General exam: Appears calm and comfortable Respiratory system: Clear to auscultation. Respiratory effort normal. Cardiovascular system: S1 & S2 heard, RRR. No murmurs, rubs, gallops or clicks. Gastrointestinal system: Abdomen is nondistended, soft and nontender. Normal bowel sounds heard. Central nervous system: Alert and oriented. No focal neurological deficits. Musculoskeletal: No edema. No calf tenderness Skin: No cyanosis. No rashes Psychiatry: Judgement and insight appear normal. Mood & affect appropriate.   Condition at discharge: stable  The results of significant diagnostics from this hospitalization (including imaging, microbiology, ancillary and laboratory) are listed below for reference.   Imaging Studies: CT ABDOMEN PELVIS W CONTRAST  Result Date: 08/07/2022 CLINICAL DATA:  Acute abdominal pain and vomiting, onset this morning EXAM: CT ABDOMEN AND PELVIS WITH CONTRAST TECHNIQUE: Multidetector CT imaging of the abdomen and pelvis was performed using the standard protocol following bolus administration of  intravenous contrast. RADIATION DOSE REDUCTION: This exam was performed according to the departmental dose-optimization program  which includes automated exposure control, adjustment of the mA and/or kV according to patient size and/or use of iterative reconstruction technique. CONTRAST:  OMNIPAQUE IOHEXOL 300 MG/ML  SOLN COMPARISON:  None Available. FINDINGS: Lower chest: Unremarkable Hepatobiliary: Unremarkable Pancreas: Unremarkable Spleen: The spleen appears unremarkable, however there is perisplenic ascites. Adrenals/Urinary Tract: Contrast in the renal collecting systems is present on initial images, possibly due to a test injection. This lowers sensitivity for nonobstructive calculi. No hydronephrosis observed. There is some mild segmental left distal hydroureter for example on image 69 series 2, past the level of the iliac vessel cross over. No ureteral calculus is identified. Stomach/Bowel: Diffuse abnormal dilation of small bowel loops extending to the terminal ileum which demonstrates stool-like internal contents as can be seen in the setting of small bowel obstruction. Small bowel loops measure up to about 3.7 cm. The colon is of reduced caliber in the somewhat retrocecal appendix appears normal. The appearance raises suspicion for a small bowel obstruction in the vicinity of the terminal ileum although a specific cause for small bowel obstruction is not identified. There is a small amount of free fluid adjacent to the terminal ileal loops, for example on image 51 series 5. A prominent small bowel ileus is a differential diagnostic consideration but the transition in caliber from the small to the large bowel tends to favor obstruction. No pneumatosis or portal venous gas. No compelling findings of perforation. Scattered mild ascites. Vascular/Lymphatic: Atherosclerosis is present, including aortoiliac atherosclerotic disease. Patent celiac trunk and superior mesenteric artery Reproductive: Unremarkable Other: Scattered mild ascites. Musculoskeletal: Lumbar spondylosis and degenerative disc disease with suspected impingement  at L3-4, L4-5, and L5-S1. IMPRESSION: 1. Diffuse abnormal dilation of small bowel loops extending to the terminal ileum which demonstrates stool-like internal contents as can be seen in the setting of small bowel obstruction. The appearance raises suspicion for small bowel obstruction in the vicinity of the terminal ileum although a specific cause for small bowel obstruction is not identified. Scattered mild ascites. Small bowel loops are dilated up to 3.7 cm; the appendix appears unremarkable. 2. Mild segmental left distal hydroureter past the level of the iliac vessel cross over. No hydronephrosis or ureteral calculus is identified. 3. Lumbar spondylosis and degenerative disc disease with suspected impingement at L3-4, L4-5, and L5-S1. 4. Aortic atherosclerosis. Aortic Atherosclerosis (ICD10-I70.0). Electronically Signed   By: Gaylyn Rong M.D.   On: 08/07/2022 19:01    Microbiology: No results found for this or any previous visit.  Labs: CBC: Recent Labs  Lab 08/07/22 1714 08/08/22 0437  WBC 12.6* 9.2  NEUTROABS 11.7*  --   HGB 15.3 13.1  HCT 44.6 37.7*  MCV 90.3 90.0  PLT 241 201   Basic Metabolic Panel: Recent Labs  Lab 08/07/22 1714 08/08/22 0437  NA 139 140  K 3.8 3.7  CL 101 105  CO2 24 26  GLUCOSE 122* 117*  BUN 13 11  CREATININE 0.77 0.87  CALCIUM 10.0 8.7*  MG 2.1  --   PHOS 3.9  --    Liver Function Tests: Recent Labs  Lab 08/07/22 1714  AST 34  ALT 24  ALKPHOS 52  BILITOT 1.2  PROT 8.6*  ALBUMIN 4.9    Discharge time spent: 35 minutes.  Signed: Jacquelin Hawking, MD [facility] 08/09/2022

## 2022-08-11 NOTE — ED Provider Notes (Signed)
[facility] Provider Note   CSN: [id number] Arrival date & time: 08/07/22  1641     History  Chief Complaint  Patient presents with   Abdominal Pain    Alfred Ponce is a 62 y.o. male.  Patient complains of abdominal pain.  No vomiting no diarrhea.  Patient has a history of alcohol abuse  The history is provided by the patient and medical records. No language interpreter was used.  Abdominal Pain Pain location:  Generalized Pain quality: aching   Pain radiates to:  Does not radiate Pain severity:  Mild Onset quality:  Sudden Timing:  Constant Progression:  Waxing and waning Chronicity:  New Context: not alcohol use   Associated symptoms: no chest pain, no cough, no diarrhea, no fatigue and no hematuria        Home Medications Prior to Admission medications   Not on File      Allergies    Patient has no known allergies.    Review of Systems   Review of Systems  Constitutional:  Negative for appetite change and fatigue.  HENT:  Negative for congestion, ear discharge and sinus pressure.   Eyes:  Negative for discharge.  Respiratory:  Negative for cough.   Cardiovascular:  Negative for chest pain.  Gastrointestinal:  Positive for abdominal pain. Negative for diarrhea.  Genitourinary:  Negative for frequency and hematuria.  Musculoskeletal:  Negative for back pain.  Skin:  Negative for rash.  Neurological:  Negative for seizures and headaches.  Psychiatric/Behavioral:  Negative for hallucinations.     Physical Exam Updated Vital Signs BP (!) 154/100   Pulse 70   Temp 97.9 F (36.6 C) (Oral)   Resp 20   Ht 5\' 6"  (1.676 m)   Wt 64.8 kg   SpO2 100%   BMI 23.06 kg/m  Physical Exam Vitals and nursing note reviewed.  Constitutional:      Appearance: He is well-developed.  HENT:     Head: Normocephalic.  Eyes:     General: No scleral icterus.    Conjunctiva/sclera: Conjunctivae normal.  Neck:     Thyroid: No thyromegaly.   Cardiovascular:     Rate and Rhythm: Normal rate and regular rhythm.     Heart sounds: No murmur heard.    No friction rub. No gallop.  Pulmonary:     Breath sounds: No stridor. No wheezing or rales.  Chest:     Chest wall: No tenderness.  Abdominal:     General: There is no distension.     Tenderness: There is abdominal tenderness. There is no rebound.  Musculoskeletal:        General: Normal range of motion.     Cervical back: Neck supple.  Lymphadenopathy:     Cervical: No cervical adenopathy.  Skin:    Findings: No erythema or rash.  Neurological:     Mental Status: He is alert and oriented to person, place, and time.     Motor: No abnormal muscle tone.     Coordination: Coordination normal.  Psychiatric:        Behavior: Behavior normal.     ED Results / Procedures / Treatments   Labs (all labs ordered are listed, but only abnormal results are displayed) Labs Reviewed  CBC WITH DIFFERENTIAL/PLATELET - Abnormal; Notable for the following components:      Result Value   WBC 12.6 (*)    Neutro Abs 11.7 (*)    Lymphs Abs 0.4 (*)  All other components within normal limits  COMPREHENSIVE METABOLIC PANEL - Abnormal; Notable for the following components:   Glucose, Bld 122 (*)    Total Protein 8.6 (*)    All other components within normal limits  BASIC METABOLIC PANEL - Abnormal; Notable for the following components:   Glucose, Bld 117 (*)    Calcium 8.7 (*)    All other components within normal limits  CBC - Abnormal; Notable for the following components:   RBC 4.19 (*)    HCT 37.7 (*)    All other components within normal limits  LIPASE, BLOOD  MAGNESIUM  PHOSPHORUS  HIV ANTIBODY (ROUTINE TESTING W REFLEX)    EKG EKG Interpretation  Date/Time:  Sunday August 07 2022 17:34:07 EDT Ventricular Rate:  75 PR Interval:  157 QRS Duration: 90 QT Interval:  426 QTC Calculation: 476 R Axis:   68 Text Interpretation: Sinus rhythm Probable left atrial  enlargement Borderline prolonged QT interval No acute changes No old tracing to compare Confirmed by Derwood Kaplan [phone]) on 08/08/2022 10:49:15 AM  Radiology No results found.  Procedures Procedures    Medications Ordered in ED Medications  pantoprazole (PROTONIX) injection 40 mg (40 mg Intravenous Given 08/07/22 1732)  sodium chloride 0.9 % bolus 500 mL (0 mLs Intravenous Stopped 08/07/22 1854)  HYDROmorphone (DILAUDID) injection 0.5 mg (0.5 mg Intravenous Given 08/07/22 1732)  ondansetron (ZOFRAN) injection 4 mg (4 mg Intravenous Given 08/07/22 1732)  iohexol (OMNIPAQUE) 300 MG/ML solution 100 mL (100 mLs Intravenous Contrast Given 08/07/22 1817)    ED Course/ Medical Decision Making/ A&P  I spoke with general surgery and they would like medicine to admit with surgery consult in the morning for this possible small bowel obstruction                           Medical Decision Making Amount and/or Complexity of Data Reviewed Labs: ordered. Radiology: ordered. ECG/medicine tests: ordered.  Risk Prescription drug management. Decision regarding hospitalization.    This patient presents to the ED for concern of abdominal pain, this involves an extensive number of treatment options, and is a complaint that carries with it a high risk of complications and morbidity.  The differential diagnosis includes appendicitis, colon cancer   Co morbidities that complicate the patient evaluation  EtOH abuse   Additional history obtained:  Additional history obtained from patient External records from outside source obtained and reviewed including hospital records   Lab Tests:  I Ordered, and personally interpreted labs.  The pertinent results include: White count 12.6   Imaging Studies ordered:  I ordered imaging studies including CT abdomen I independently visualized and interpreted imaging which showed possible small bowel obstruction I agree with the radiologist  interpretation   Cardiac Monitoring: / EKG:  The patient was maintained on a cardiac monitor.  I personally viewed and interpreted the cardiac monitored which showed an underlying rhythm of: Normal sinus rhythm   Consultations Obtained:  I requested consultation with the general surgery and internal medicine,  and discussed lab and imaging findings as well as pertinent plan - they recommend: Medicine admit with surgery consult   Problem List / ED Course / Critical interventions / Medication management  EtOH abuse send small bowel obstruction I ordered medication including Dilaudid for pain Reevaluation of the patient after these medicines showed that the patient improved I have reviewed the patients home medicines and have made adjustments as needed   Social  Determinants of Health:  EtOH abuse   Test / Admission - Considered:  None   Small bowel obstruction, admit to medicine with surgery consult        Final Clinical Impression(s) / ED Diagnoses Final diagnoses:  None    Rx / DC Orders ED Discharge Orders     None         Bethann Berkshire, MD 08/11/22 1139

## 2022-08-18 ENCOUNTER — Telehealth: Payer: Self-pay

## 2022-08-18 NOTE — Telephone Encounter (Signed)
Received patient's information from [facility] to refer the patient to the [facility] program. Unable to get into contact with patient. The patient was sent a courtesy text that informed them what our program is about, our address and call back number
